# Patient Record
Sex: Male | Born: 1985 | Race: White | Hispanic: No | Marital: Single | Smoking: Current every day smoker
Health system: Southern US, Community
[De-identification: ages and names within clinical notes are randomized; demographics above are authoritative.]

## PROBLEM LIST (undated history)

## (undated) DIAGNOSIS — R05 Cough: Secondary | ICD-10-CM

## (undated) DIAGNOSIS — R059 Cough, unspecified: Secondary | ICD-10-CM

## (undated) DIAGNOSIS — F419 Anxiety disorder, unspecified: Secondary | ICD-10-CM

## (undated) DIAGNOSIS — N200 Calculus of kidney: Secondary | ICD-10-CM

## (undated) HISTORY — DX: Anxiety disorder, unspecified: F41.9

---

## 2011-05-07 ENCOUNTER — Telehealth (INDEPENDENT_AMBULATORY_CARE_PROVIDER_SITE_OTHER): Payer: Self-pay

## 2011-05-07 NOTE — Telephone Encounter (Signed)
LMOM for pt to call me so I can move his appt.

## 2011-05-08 ENCOUNTER — Telehealth (INDEPENDENT_AMBULATORY_CARE_PROVIDER_SITE_OTHER): Payer: Self-pay

## 2011-05-08 NOTE — Telephone Encounter (Signed)
Pt returned my call and I gave him the appt info.

## 2011-05-16 ENCOUNTER — Encounter (INDEPENDENT_AMBULATORY_CARE_PROVIDER_SITE_OTHER): Payer: Self-pay | Admitting: General Surgery

## 2011-05-16 ENCOUNTER — Ambulatory Visit (INDEPENDENT_AMBULATORY_CARE_PROVIDER_SITE_OTHER): Payer: BC Managed Care – PPO | Admitting: General Surgery

## 2011-05-16 VITALS — BP 122/82 | HR 80 | Resp 18 | Ht 67.0 in | Wt 131.4 lb

## 2011-05-16 DIAGNOSIS — K4090 Unilateral inguinal hernia, without obstruction or gangrene, not specified as recurrent: Secondary | ICD-10-CM

## 2011-05-16 NOTE — Progress Notes (Signed)
Patient ID: Donald Reid, male   DOB: 02/15/1985, 26 y.o.   MRN: [id number]  Chief Complaint  Patient presents with  . Inguinal Hernia    Right    HPI Donald Reid is a 26 y.o. male.  Referred by Dr. Jones HPI This is a 26-year-old male is otherwise very healthy who about a month ago while working noticed some pain in his right groin. He felt a small mass at that point. He pushes it back in very easily. It now comes out on a daily basis while he is working. It always goes away at night. He does have some discomfort that is associated with this. He has no change in his bowel movements or any change with urination.  History reviewed. No pertinent past medical history.  History reviewed. No pertinent past surgical history.  History reviewed. No pertinent family history.  Social History History  Substance Use Topics  . Smoking status: Current Everyday Smoker -- 1.0 packs/day    Types: Cigarettes  . Smokeless tobacco: Not on file  . Alcohol Use: No    No Known Allergies  No current outpatient prescriptions on file.    Review of Systems Review of Systems  Constitutional: Negative for fever, chills and unexpected weight change.  HENT: Negative for hearing loss, congestion, sore throat, trouble swallowing and voice change.   Eyes: Negative for visual disturbance.  Respiratory: Negative for cough and wheezing.   Cardiovascular: Negative for chest pain, palpitations and leg swelling.  Gastrointestinal: Negative for nausea, vomiting, abdominal pain, diarrhea, constipation, blood in stool, abdominal distention, anal bleeding and rectal pain.  Genitourinary: Negative for hematuria and difficulty urinating.  Musculoskeletal: Negative for arthralgias.  Skin: Negative for rash and wound.  Neurological: Negative for seizures, syncope, weakness and headaches.  Hematological: Negative for adenopathy. Does not bruise/bleed easily.  Psychiatric/Behavioral: Negative for confusion.     Blood pressure 122/82, pulse 80, resp. rate 18, height 5' 7" (1.702 m), weight 131 lb 6 oz (59.591 kg).  Physical Exam Physical Exam  Vitals reviewed. Constitutional: He appears well-developed and well-nourished.  Neck: Neck supple.  Cardiovascular: Normal rate, regular rhythm and normal heart sounds.   Pulmonary/Chest: Effort normal and breath sounds normal. He has no wheezes. He has no rales.  Abdominal: Soft. There is no tenderness. A hernia is present. Hernia confirmed positive in the right inguinal area. Hernia confirmed negative in the left inguinal area.  Lymphadenopathy:    He has no cervical adenopathy.     Assessment    RIH    Plan    We discussed observation versus repair.  We discussed both laparoscopic and open inguinal hernia repairs. I described the procedure in detail.  The patient was given educational material.  Goals should be achieved with surgery. We discussed the usage of mesh and the rationale behind that. We went over the pathophysiology of inguinal hernia. We have elected to perform open inguinal hernia repair with mesh.  We discussed the risks including bleeding, infection, recurrence, postoperative pain and chronic groin pain, testicular injury, urinary retention, numbness in groin and around incision.         Saliah Crisp 05/16/2011, 10:56 AM    

## 2011-05-19 ENCOUNTER — Other Ambulatory Visit (INDEPENDENT_AMBULATORY_CARE_PROVIDER_SITE_OTHER): Payer: Self-pay | Admitting: General Surgery

## 2011-05-21 ENCOUNTER — Ambulatory Visit (INDEPENDENT_AMBULATORY_CARE_PROVIDER_SITE_OTHER): Payer: Self-pay | Admitting: General Surgery

## 2011-05-26 ENCOUNTER — Encounter (HOSPITAL_COMMUNITY): Payer: Self-pay | Admitting: Pharmacy Technician

## 2011-05-28 ENCOUNTER — Encounter (HOSPITAL_COMMUNITY)
Admission: RE | Admit: 2011-05-28 | Discharge: 2011-05-28 | Disposition: A | Discharge status: Home / Self Care | Payer: BC Managed Care – PPO | Source: Ambulatory Visit | Attending: General Surgery | Admitting: General Surgery

## 2011-05-28 ENCOUNTER — Ambulatory Visit (HOSPITAL_COMMUNITY)
Admission: RE | Admit: 2011-05-28 | Discharge: 2011-05-28 | Disposition: A | Discharge status: Home / Self Care | Payer: BC Managed Care – PPO | Source: Ambulatory Visit | Attending: General Surgery | Admitting: General Surgery

## 2011-05-28 ENCOUNTER — Encounter (HOSPITAL_COMMUNITY): Payer: Self-pay

## 2011-05-28 DIAGNOSIS — Z87891 Personal history of nicotine dependence: Secondary | ICD-10-CM | POA: Insufficient documentation

## 2011-05-28 DIAGNOSIS — Z01812 Encounter for preprocedural laboratory examination: Secondary | ICD-10-CM | POA: Insufficient documentation

## 2011-05-28 DIAGNOSIS — Z01818 Encounter for other preprocedural examination: Secondary | ICD-10-CM | POA: Insufficient documentation

## 2011-05-28 HISTORY — DX: Cough, unspecified: R05.9

## 2011-05-28 HISTORY — DX: Cough: R05

## 2011-05-28 HISTORY — DX: Calculus of kidney: N20.0

## 2011-05-28 LAB — CBC
MCH: 31.3 pg (ref 26.0–34.0)
MCHC: 34.7 g/dL (ref 30.0–36.0)
Platelets: 208 10*3/uL (ref 150–400)

## 2011-05-28 LAB — SURGICAL PCR SCREEN: MRSA, PCR: NEGATIVE

## 2011-05-28 NOTE — Patient Instructions (Addendum)
20 LARRON ARMOR  05/28/2011   Your procedure is scheduled on: Friday 05-30-2011   Report to [facility] at 0700  AM.  Call this number if you have problems the morning of surgery: [phone]   Remember:DRIVER Humphrey Rolls MOTHER CELL [phone]   Do not eat food or drink liquids:After Midnight.  .  Take these medicines the morning of surgery with A SIP OF WATER: NO MEDS TO TAKE   Do not wear jewelry or make up.  Do not wear lotions, powders, or perfumes.Do not wear deodorant.    Do not bring valuables to the hospital.  Contacts, dentures or bridgework may not be worn into surgery.  Leave suitcase in the car. After surgery it may be brought to your room.  For patients admitted to the hospital, checkout time is 11:00 AM the day of discharge.     Special Instructions: BETASEPT Shower Use Special Wash: 1/2 bottle night before surgery and 1/2 bottle morning of surgery.neck down avoid private area   Please read over the following fact sheets that you were given: MRSA Information  [facility] pre op nurse phone number [phone], call if needed

## 2011-05-30 ENCOUNTER — Encounter (HOSPITAL_COMMUNITY): Payer: Self-pay | Admitting: *Deleted

## 2011-05-30 ENCOUNTER — Encounter (HOSPITAL_COMMUNITY)
Admission: RE | Disposition: A | Discharge status: Home / Self Care | Payer: Self-pay | Source: Ambulatory Visit | Attending: General Surgery

## 2011-05-30 ENCOUNTER — Ambulatory Visit (HOSPITAL_COMMUNITY): Payer: BC Managed Care – PPO | Admitting: Anesthesiology

## 2011-05-30 ENCOUNTER — Encounter (HOSPITAL_COMMUNITY): Payer: Self-pay | Admitting: Anesthesiology

## 2011-05-30 ENCOUNTER — Ambulatory Visit (HOSPITAL_COMMUNITY)
Admission: RE | Admit: 2011-05-30 | Discharge: 2011-05-30 | Disposition: A | Discharge status: Home / Self Care | Payer: BC Managed Care – PPO | Source: Ambulatory Visit | Attending: General Surgery | Admitting: General Surgery

## 2011-05-30 DIAGNOSIS — K4090 Unilateral inguinal hernia, without obstruction or gangrene, not specified as recurrent: Secondary | ICD-10-CM

## 2011-05-30 HISTORY — PX: INGUINAL HERNIA REPAIR: SHX194

## 2011-05-30 SURGERY — REPAIR, HERNIA, INGUINAL, ADULT
Anesthesia: General | Site: Abdomen | Laterality: Right | Wound class: Clean

## 2011-05-30 MED ORDER — ACETAMINOPHEN 10 MG/ML IV SOLN
INTRAVENOUS | Status: AC
  Filled 2011-05-30: qty 100

## 2011-05-30 MED ORDER — PROPOFOL 10 MG/ML IV BOLUS
INTRAVENOUS | Status: DC | PRN
  Administered 2011-05-30: 200 mg via INTRAVENOUS

## 2011-05-30 MED ORDER — LIDOCAINE HCL (CARDIAC) 10 MG/ML IV SOLN
INTRAVENOUS | Status: DC | PRN
  Administered 2011-05-30: 80 mg via INTRAVENOUS

## 2011-05-30 MED ORDER — ONDANSETRON HCL 4 MG/2ML IJ SOLN
INTRAMUSCULAR | Status: DC | PRN
  Administered 2011-05-30: 4 mg via INTRAVENOUS

## 2011-05-30 MED ORDER — KETOROLAC TROMETHAMINE 30 MG/ML IJ SOLN
15.0000 mg | Freq: Once | INTRAMUSCULAR | Status: AC | PRN
  Administered 2011-05-30: 30 mg via INTRAVENOUS

## 2011-05-30 MED ORDER — SUFENTANIL CITRATE 50 MCG/ML IV SOLN
INTRAVENOUS | Status: DC | PRN
  Administered 2011-05-30 (×3): 5 ug via INTRAVENOUS

## 2011-05-30 MED ORDER — ACETAMINOPHEN 10 MG/ML IV SOLN
INTRAVENOUS | Status: DC | PRN
  Administered 2011-05-30: 1000 mg via INTRAVENOUS

## 2011-05-30 MED ORDER — DEXAMETHASONE SODIUM PHOSPHATE 10 MG/ML IJ SOLN
INTRAMUSCULAR | Status: DC | PRN
  Administered 2011-05-30: 10 mg via INTRAVENOUS

## 2011-05-30 MED ORDER — BUPIVACAINE HCL (PF) 0.25 % IJ SOLN
INTRAMUSCULAR | Status: DC | PRN
  Administered 2011-05-30: 20 mL

## 2011-05-30 MED ORDER — FENTANYL CITRATE 0.05 MG/ML IJ SOLN
25.0000 ug | INTRAMUSCULAR | Status: DC | PRN
  Administered 2011-05-30 (×2): 25 ug via INTRAVENOUS

## 2011-05-30 MED ORDER — FENTANYL CITRATE 0.05 MG/ML IJ SOLN
INTRAMUSCULAR | Status: AC
  Filled 2011-05-30: qty 2

## 2011-05-30 MED ORDER — OXYCODONE-ACETAMINOPHEN 5-325 MG PO TABS: 1.0000 | ORAL_TABLET | ORAL | Status: AC | PRN

## 2011-05-30 MED ORDER — PROMETHAZINE HCL 25 MG/ML IJ SOLN: 6.2500 mg | INTRAMUSCULAR | Status: DC | PRN

## 2011-05-30 MED ORDER — CEFAZOLIN SODIUM 1-5 GM-% IV SOLN
INTRAVENOUS | Status: AC
  Filled 2011-05-30: qty 50

## 2011-05-30 MED ORDER — OXYCODONE HCL 5 MG PO TABS
ORAL_TABLET | ORAL | Status: AC
  Filled 2011-05-30: qty 1

## 2011-05-30 MED ORDER — MIDAZOLAM HCL 5 MG/5ML IJ SOLN
INTRAMUSCULAR | Status: DC | PRN
  Administered 2011-05-30: 1 mg via INTRAVENOUS
  Administered 2011-05-30: 2 mg via INTRAVENOUS

## 2011-05-30 MED ORDER — GLYCOPYRROLATE 0.2 MG/ML IJ SOLN
INTRAMUSCULAR | Status: DC | PRN
  Administered 2011-05-30: 0.2 mg via INTRAVENOUS

## 2011-05-30 MED ORDER — DROPERIDOL 2.5 MG/ML IJ SOLN
INTRAMUSCULAR | Status: DC | PRN
  Administered 2011-05-30: 0.625 mg via INTRAVENOUS

## 2011-05-30 MED ORDER — OXYCODONE HCL 5 MG PO TABS
5.0000 mg | ORAL_TABLET | ORAL | Status: DC | PRN
  Administered 2011-05-30 (×2): 5 mg via ORAL

## 2011-05-30 MED ORDER — LACTATED RINGERS IV SOLN
INTRAVENOUS | Status: DC
  Administered 2011-05-30: 10:00:00 via INTRAVENOUS
  Administered 2011-05-30: 1000 mL via INTRAVENOUS

## 2011-05-30 MED ORDER — 0.9 % SODIUM CHLORIDE (POUR BTL) OPTIME
TOPICAL | Status: DC | PRN
  Administered 2011-05-30: 1000 mL

## 2011-05-30 MED ORDER — BUPIVACAINE HCL (PF) 0.25 % IJ SOLN
INTRAMUSCULAR | Status: AC
  Filled 2011-05-30: qty 30

## 2011-05-30 MED ORDER — KETOROLAC TROMETHAMINE 30 MG/ML IJ SOLN
INTRAMUSCULAR | Status: AC
  Filled 2011-05-30: qty 1

## 2011-05-30 MED ORDER — CEFAZOLIN SODIUM 1-5 GM-% IV SOLN
1.0000 g | INTRAVENOUS | Status: AC
  Administered 2011-05-30: 1 g via INTRAVENOUS

## 2011-05-30 SURGICAL SUPPLY — 39 items
BENZOIN TINCTURE PRP APPL 2/3 (GAUZE/BANDAGES/DRESSINGS) IMPLANT
BLADE HEX COATED 2.75 (ELECTRODE) ×2 IMPLANT
CANISTER SUCTION 2500CC (MISCELLANEOUS) ×2 IMPLANT
CLOTH BEACON ORANGE TIMEOUT ST (SAFETY) ×2 IMPLANT
DECANTER SPIKE VIAL GLASS SM (MISCELLANEOUS) IMPLANT
DERMABOND ADVANCED (GAUZE/BANDAGES/DRESSINGS) ×2
DERMABOND ADVANCED .7 DNX12 (GAUZE/BANDAGES/DRESSINGS) ×2 IMPLANT
DRAIN PENROSE 18X1/2 LTX STRL (DRAIN) ×2 IMPLANT
DRAPE LAPAROSCOPIC ABDOMINAL (DRAPES) ×2 IMPLANT
ELECT REM PT RETURN 9FT ADLT (ELECTROSURGICAL) ×2
ELECTRODE REM PT RTRN 9FT ADLT (ELECTROSURGICAL) ×1 IMPLANT
GAUZE SPONGE 4X4 16PLY XRAY LF (GAUZE/BANDAGES/DRESSINGS) IMPLANT
GLOVE BIO SURGEON STRL SZ7 (GLOVE) ×2 IMPLANT
GLOVE BIOGEL PI IND STRL 7.0 (GLOVE) IMPLANT
GLOVE BIOGEL PI IND STRL 7.5 (GLOVE) ×1 IMPLANT
GLOVE BIOGEL PI INDICATOR 7.0 (GLOVE)
GLOVE BIOGEL PI INDICATOR 7.5 (GLOVE) ×1
GOWN PREVENTION PLUS LG XLONG (DISPOSABLE) ×2 IMPLANT
GOWN PREVENTION PLUS XLARGE (GOWN DISPOSABLE) ×2 IMPLANT
GOWN STRL NON-REIN LRG LVL3 (GOWN DISPOSABLE) ×2 IMPLANT
GOWN STRL REIN XL XLG (GOWN DISPOSABLE) IMPLANT
KIT BASIN OR (CUSTOM PROCEDURE TRAY) ×2 IMPLANT
MESH ULTRAPRO 3X6 7.6X15CM (Mesh General) ×2 IMPLANT
NEEDLE HYPO 25X1 1.5 SAFETY (NEEDLE) ×2 IMPLANT
NS IRRIG 1000ML POUR BTL (IV SOLUTION) ×2 IMPLANT
PACK GENERAL/GYN (CUSTOM PROCEDURE TRAY) ×2 IMPLANT
SPONGE GAUZE 4X4 12PLY (GAUZE/BANDAGES/DRESSINGS) IMPLANT
STRIP CLOSURE SKIN 1/2X4 (GAUZE/BANDAGES/DRESSINGS) IMPLANT
SUT MNCRL AB 4-0 PS2 18 (SUTURE) ×2 IMPLANT
SUT PROLENE 2 0 SH DA (SUTURE) ×6 IMPLANT
SUT SILK 2 0 SH (SUTURE) IMPLANT
SUT VIC AB 2-0 SH 27 (SUTURE) ×2
SUT VIC AB 2-0 SH 27X BRD (SUTURE) ×2 IMPLANT
SUT VIC AB 3-0 54XBRD REEL (SUTURE) IMPLANT
SUT VIC AB 3-0 BRD 54 (SUTURE)
SUT VIC AB 3-0 SH 27 (SUTURE) ×1
SUT VIC AB 3-0 SH 27XBRD (SUTURE) ×1 IMPLANT
SYR CONTROL 10ML LL (SYRINGE) ×2 IMPLANT
TOWEL OR 17X26 10 PK STRL BLUE (TOWEL DISPOSABLE) ×2 IMPLANT

## 2011-05-30 NOTE — Discharge Instructions (Signed)
[facility] ° °UMBILICAL OR INGUINAL HERNIA REPAIR: POST OP INSTRUCTIONS ° °Always review your discharge instruction sheet given to you by the facility where your surgery was performed. °IF YOU HAVE DISABILITY OR FAMILY LEAVE FORMS, YOU MUST BRING THEM TO THE OFFICE FOR PROCESSING.   °DO NOT GIVE THEM TO YOUR DOCTOR. ° °1. A  prescription for pain medication may be given to you upon discharge.  Take your pain medication as prescribed, if needed.  If narcotic pain medicine is not needed, then you may take acetaminophen (Tylenol), naprosyn (Alleve) or ibuprofen (Advil) as needed. °2. Take your usually prescribed medications unless otherwise directed. °3. If you need a refill on your pain medication, please contact your pharmacy.  They will contact our office to request authorization. Prescriptions will not be filled after 5 pm or on week-ends. °4. You should follow a light diet the first 24 hours after arrival home, such as soup and crackers, etc.  Be sure to include lots of fluids daily.  Resume your normal diet the day after surgery. °5. Most patients will experience some swelling and bruising around the umbilicus or in the groin and scrotum.  Ice packs and reclining will help.  Swelling and bruising can take several days to resolve.  °6. It is common to experience some constipation if taking pain medication after surgery.  Increasing fluid intake and taking a stool softener (such as Colace) will usually help or prevent this problem from occurring.  A mild laxative (Milk of Magnesia or Miralax) should be taken according to package directions if there are no bowel movements after 48 hours. °7. Unless discharge instructions indicate otherwise, you may remove your bandages 48 hours after surgery, and you may shower at that time.  You may have steri-strips (small skin tapes) in place directly over the incision.  These strips should be left on the skin for 7-10 days and will come off on their own.   If your surgeon used skin glue on the incision, you may shower in 24 hours.  The glue will flake off over the next 2-3 weeks.  Any sutures or staples will be removed at the office during your follow-up visit. °8. ACTIVITIES:  You may resume regular (light) daily activities beginning the next day--such as daily self-care, walking, climbing stairs--gradually increasing activities as tolerated.  You may have sexual intercourse when it is comfortable.  Refrain from any heavy lifting or straining until approved by your doctor. °a. You may drive when you are no longer taking prescription pain medication, you can comfortably wear a seatbelt, and you can safely maneuver your car and apply brakes. °b. RETURN TO WORK:  __________________________________________________________ °9. You should see your doctor in the office for a follow-up appointment approximately 2-3 weeks after your surgery.  Make sure that you call for this appointment within a day or two after you arrive home to insure a convenient appointment time. °10. OTHER INSTRUCTIONS:  __________________________________________________________________________________________________________________________________________________________________________________________  °WHEN TO CALL YOUR DOCTOR: °1. Fever over 101.0 °2. Inability to urinate °3. Nausea and/or vomiting °4. Extreme swelling or bruising °5. Continued bleeding from incision. °6. Increased pain, redness, or drainage from the incision ° °The clinic staff is available to answer your questions during regular business hours.  Please don’t hesitate to call and ask to speak to one of the nurses for clinical concerns.  If you have a medical emergency, go to the nearest emergency room or call 911.  A surgeon from [facility]   is always on call at the hospital ° ° °[address], [address], [place], [place]  [address] ? ° [address] [address], [place], [place]   [address] °[phone] ? [phone] ? FAX  [phone] °Web site: [email or web address] ° ° °

## 2011-05-30 NOTE — Anesthesia Preprocedure Evaluation (Signed)
Anesthesia Evaluation  Patient identified by MRN, date of birth, ID band Patient awake    Reviewed: Allergy & Precautions, H&P , NPO status , Patient's Chart, lab work & pertinent test results  Airway Mallampati: II TM Distance: >3 FB Neck ROM: Full    Dental No notable dental hx.    Pulmonary Current Smoker,  breath sounds clear to auscultation  Pulmonary exam normal       Cardiovascular negative cardio ROS  Rhythm:Regular Rate:Normal     Neuro/Psych negative neurological ROS  negative psych ROS   GI/Hepatic negative GI ROS, Neg liver ROS,   Endo/Other  negative endocrine ROS  Renal/GU negative Renal ROS  negative genitourinary   Musculoskeletal negative musculoskeletal ROS (+)   Abdominal   Peds negative pediatric ROS (+)  Hematology negative hematology ROS (+)   Anesthesia Other Findings   Reproductive/Obstetrics negative OB ROS                           Anesthesia Physical Anesthesia Plan  ASA: II  Anesthesia Plan: General   Post-op Pain Management:    Induction: Intravenous  Airway Management Planned: LMA  Additional Equipment:   Intra-op Plan:   Post-operative Plan:   Informed Consent: I have reviewed the patients History and Physical, chart, labs and discussed the procedure including the risks, benefits and alternatives for the proposed anesthesia with the patient or authorized representative who has indicated his/her understanding and acceptance.   Dental advisory given  Plan Discussed with: CRNA  Anesthesia Plan Comments:         Anesthesia Quick Evaluation  

## 2011-05-30 NOTE — H&P (View-Only) (Signed)
Patient ID: Genelle Bal P. Fofana, male   DOB: 1985-09-01, 26 y.o.   MRN: [id number]  Chief Complaint  Patient presents with  . Inguinal Hernia    Right    HPI Miking P. Majerus is a 26 y.o. male.  Referred by Dr. Yetta Barre HPI This is a 26 year old male is otherwise very healthy who about a month ago while working noticed some pain in his right groin. He felt a small mass at that point. He pushes it back in very easily. It now comes out on a daily basis while he is working. It always goes away at night. He does have some discomfort that is associated with this. He has no change in his bowel movements or any change with urination.  History reviewed. No pertinent past medical history.  History reviewed. No pertinent past surgical history.  History reviewed. No pertinent family history.  Social History History  Substance Use Topics  . Smoking status: Current Everyday Smoker -- 1.0 packs/day    Types: Cigarettes  . Smokeless tobacco: Not on file  . Alcohol Use: No    No Known Allergies  No current outpatient prescriptions on file.    Review of Systems Review of Systems  Constitutional: Negative for fever, chills and unexpected weight change.  HENT: Negative for hearing loss, congestion, sore throat, trouble swallowing and voice change.   Eyes: Negative for visual disturbance.  Respiratory: Negative for cough and wheezing.   Cardiovascular: Negative for chest pain, palpitations and leg swelling.  Gastrointestinal: Negative for nausea, vomiting, abdominal pain, diarrhea, constipation, blood in stool, abdominal distention, anal bleeding and rectal pain.  Genitourinary: Negative for hematuria and difficulty urinating.  Musculoskeletal: Negative for arthralgias.  Skin: Negative for rash and wound.  Neurological: Negative for seizures, syncope, weakness and headaches.  Hematological: Negative for adenopathy. Does not bruise/bleed easily.  Psychiatric/Behavioral: Negative for confusion.     Blood pressure 122/82, pulse 80, resp. rate 18, height 5\' 7"  (1.702 m), weight 131 lb 6 oz (59.591 kg).  Physical Exam Physical Exam  Vitals reviewed. Constitutional: He appears well-developed and well-nourished.  Neck: Neck supple.  Cardiovascular: Normal rate, regular rhythm and normal heart sounds.   Pulmonary/Chest: Effort normal and breath sounds normal. He has no wheezes. He has no rales.  Abdominal: Soft. There is no tenderness. A hernia is present. Hernia confirmed positive in the right inguinal area. Hernia confirmed negative in the left inguinal area.  Lymphadenopathy:    He has no cervical adenopathy.     Assessment    RIH    Plan    We discussed observation versus repair.  We discussed both laparoscopic and open inguinal hernia repairs. I described the procedure in detail.  The patient was given educational material.  Goals should be achieved with surgery. We discussed the usage of mesh and the rationale behind that. We went over the pathophysiology of inguinal hernia. We have elected to perform open inguinal hernia repair with mesh.  We discussed the risks including bleeding, infection, recurrence, postoperative pain and chronic groin pain, testicular injury, urinary retention, numbness in groin and around incision.         Rowynn Mcweeney 05/16/2011, 10:56 AM

## 2011-05-30 NOTE — Op Note (Signed)
Preoperative diagnosis: Symptomatic right inguinal hernia Postoperative diagnosis: Indirect right inguinal hernia Procedure: Right inguinal hernia repair with UltraPro mesh patch Surgeon: Dr. Harden Mo Specimens: None Estimated blood loss: Minimal Complications: None Sponge needle count correct x2 at operation Disposition to recovery in stable condition Anesthesia: Gen. With LMA  Indications: This is a 26 year old male with a symptomatic right inguinal hernia. We discussed the risks and benefits of repair and decided to proceed with an open repair.  Procedure: After informed consent was obtained the patient was taken to the operating room. He was administered 1 g of intravenous cefazolin. Sequential compression devices were placed on his legs prior to induction. He then underwent general anesthesia with an LMA. His right groin was then prepped and draped in the standard sterile surgical fashion. A surgical timeout was performed.  I infiltrated quarter percent Marcaine throughout the right groin. I also performed an ilioinguinal nerve block. I then made an incision used cautery to divide the tissue down to the external oblique. The external oblique was entered sharply. I opened this through the external ring. I then encircled the spermatic cord with a Penrose. This floor was very weak but there was no obvious hernia. He had an indirect hernia sac which was dissected free from the remaining cord structures which were preserved. I then placed this back inside the abdomen. I then closed his ring down with a 2-0 Vicryl suture. Following this I then placed an UltraPro mesh patch overlying the floor. This was sutured into position in numerous places with 2-0 Prolene suture. I then made a cut and wrapped this around the spermatic cord. I then packed this together as well. The mesh was in good position and obliterated the defect. Hemostasis was observed. I then closes with 2-0 Vicryl, 3-0 Vicryl, and 4  Monocryl. Dermabond was placed on the wound. He tolerated this well was extubated transferred recovery in stable condition.

## 2011-05-30 NOTE — Transfer of Care (Signed)
Immediate Anesthesia Transfer of Care Note  Patient: Donald Reid  Procedure(s) Performed: Procedure(s) (LRB): HERNIA REPAIR INGUINAL ADULT (Right) INSERTION OF MESH (Right)  Patient Location: PACU  Anesthesia Type: General  Level of Consciousness: awake, patient cooperative and responds to stimulation  Airway & Oxygen Therapy: Patient Spontanous Breathing and Patient connected to face mask oxygen  Post-op Assessment: Report given to PACU RN, Post -op Vital signs reviewed and stable and Patient moving all extremities X 4  Post vital signs: stable  Complications: No apparent anesthesia complications

## 2011-05-30 NOTE — Interval H&P Note (Signed)
History and Physical Interval Note:  05/30/2011 8:45 AM  Vidal Schwalbe  has presented today for surgery, with the diagnosis of fix hole in right groin with screen  The various methods of treatment have been discussed with the patient and family. After consideration of risks, benefits and other options for treatment, the patient has consented to  Procedure(s) (LRB): HERNIA REPAIR INGUINAL ADULT (Right) INSERTION OF MESH (N/A) as a surgical intervention .  The patients' history has been reviewed, patient examined, no change in status, stable for surgery.  I have reviewed the patients' chart and labs.  Questions were answered to the patient's satisfaction.     Mahala Rommel

## 2011-05-30 NOTE — Anesthesia Postprocedure Evaluation (Signed)
  Anesthesia Post-op Note  Patient: Donald Reid  Procedure(s) Performed: Procedure(s) (LRB): HERNIA REPAIR INGUINAL ADULT (Right) INSERTION OF MESH (Right)  Patient Location: PACU  Anesthesia Type: General  Level of Consciousness: awake and alert   Airway and Oxygen Therapy: Patient Spontanous Breathing  Post-op Pain: mild  Post-op Assessment: Post-op Vital signs reviewed, Patient's Cardiovascular Status Stable, Respiratory Function Stable, Patent Airway and No signs of Nausea or vomiting  Post-op Vital Signs: stable  Complications: No apparent anesthesia complications

## 2011-06-03 ENCOUNTER — Encounter (HOSPITAL_COMMUNITY): Payer: Self-pay | Admitting: General Surgery

## 2011-06-26 ENCOUNTER — Ambulatory Visit (INDEPENDENT_AMBULATORY_CARE_PROVIDER_SITE_OTHER): Payer: BC Managed Care – PPO | Admitting: General Surgery

## 2011-06-26 ENCOUNTER — Encounter (INDEPENDENT_AMBULATORY_CARE_PROVIDER_SITE_OTHER): Payer: Self-pay | Admitting: General Surgery

## 2011-06-26 VITALS — BP 104/78 | HR 80 | Temp 97.8°F | Ht 67.0 in | Wt 131.0 lb

## 2011-06-26 DIAGNOSIS — Z09 Encounter for follow-up examination after completed treatment for conditions other than malignant neoplasm: Secondary | ICD-10-CM

## 2011-06-26 NOTE — Progress Notes (Signed)
Subjective:     Patient ID: Donald Reid, male   DOB: [birth year], 26 y.o.   MRN: [id number]  HPI 70 yom s/p rih repair with mesh.  He is doing well except for some swelling.  He is returning to normal activities and is otherwise doing well.  Review of Systems     Objective:   Physical Exam    healing right groin incision without infection, small healing ridge present Assessment:     S/p RIH repair    Plan:     May return to work and start resuming normal activities I told him the incision would continue to soften over time

## 2015-07-25 ENCOUNTER — Emergency Department: Payer: Self-pay

## 2015-07-25 ENCOUNTER — Emergency Department
Admission: EM | Admit: 2015-07-25 | Discharge: 2015-07-25 | Disposition: A | Discharge status: Home / Self Care | Payer: Self-pay | Attending: Emergency Medicine | Admitting: Emergency Medicine

## 2015-07-25 DIAGNOSIS — R110 Nausea: Secondary | ICD-10-CM | POA: Insufficient documentation

## 2015-07-25 DIAGNOSIS — N200 Calculus of kidney: Secondary | ICD-10-CM | POA: Insufficient documentation

## 2015-07-25 DIAGNOSIS — F17210 Nicotine dependence, cigarettes, uncomplicated: Secondary | ICD-10-CM | POA: Insufficient documentation

## 2015-07-25 DIAGNOSIS — F1290 Cannabis use, unspecified, uncomplicated: Secondary | ICD-10-CM | POA: Insufficient documentation

## 2015-07-25 LAB — URINALYSIS COMPLETE WITH MICROSCOPIC (ARMC ONLY)
BILIRUBIN URINE: NEGATIVE
Bacteria, UA: NONE SEEN
Glucose, UA: NEGATIVE mg/dL
KETONES UR: NEGATIVE mg/dL
Leukocytes, UA: NEGATIVE
NITRITE: NEGATIVE
PROTEIN: 100 mg/dL — AB
SPECIFIC GRAVITY, URINE: 1.024 (ref 1.005–1.030)
pH: 6 (ref 5.0–8.0)

## 2015-07-25 LAB — CBC
HCT: 45 % (ref 40.0–52.0)
HEMOGLOBIN: 15.6 g/dL (ref 13.0–18.0)
MCH: 31.5 pg (ref 26.0–34.0)
MCHC: 34.6 g/dL (ref 32.0–36.0)
MCV: 91 fL (ref 80.0–100.0)
PLATELETS: 256 10*3/uL (ref 150–440)
RBC: 4.95 MIL/uL (ref 4.40–5.90)
RDW: 13.4 % (ref 11.5–14.5)
WBC: 13.4 10*3/uL — ABNORMAL HIGH (ref 3.8–10.6)

## 2015-07-25 LAB — BASIC METABOLIC PANEL
Anion gap: 9 (ref 5–15)
BUN: 19 mg/dL (ref 6–20)
CHLORIDE: 105 mmol/L (ref 101–111)
CO2: 23 mmol/L (ref 22–32)
CREATININE: 0.92 mg/dL (ref 0.61–1.24)
Calcium: 8.7 mg/dL — ABNORMAL LOW (ref 8.9–10.3)
Glucose, Bld: 149 mg/dL — ABNORMAL HIGH (ref 65–99)
POTASSIUM: 3.6 mmol/L (ref 3.5–5.1)
SODIUM: 137 mmol/L (ref 135–145)

## 2015-07-25 MED ORDER — ONDANSETRON 4 MG PO TBDP: 4.0000 mg | ORAL_TABLET | Freq: Three times a day (TID) | ORAL | Status: DC | PRN

## 2015-07-25 MED ORDER — FENTANYL CITRATE (PF) 100 MCG/2ML IJ SOLN
INTRAMUSCULAR | Status: AC
  Administered 2015-07-25: 25 ug via INTRAVENOUS
  Filled 2015-07-25: qty 2

## 2015-07-25 MED ORDER — SODIUM CHLORIDE 0.9 % IV BOLUS (SEPSIS)
1000.0000 mL | Freq: Once | INTRAVENOUS | Status: AC
  Administered 2015-07-25: 1000 mL via INTRAVENOUS

## 2015-07-25 MED ORDER — FENTANYL CITRATE (PF) 100 MCG/2ML IJ SOLN
25.0000 ug | Freq: Once | INTRAMUSCULAR | Status: AC
  Administered 2015-07-25: 25 ug via INTRAVENOUS

## 2015-07-25 MED ORDER — ONDANSETRON HCL 4 MG/2ML IJ SOLN
INTRAMUSCULAR | Status: AC
  Administered 2015-07-25: 4 mg via INTRAVENOUS
  Filled 2015-07-25: qty 2

## 2015-07-25 MED ORDER — OXYCODONE-ACETAMINOPHEN 5-325 MG PO TABS: 1.0000 | ORAL_TABLET | ORAL | Status: DC | PRN

## 2015-07-25 MED ORDER — TAMSULOSIN HCL 0.4 MG PO CAPS
0.4000 mg | ORAL_CAPSULE | Freq: Every day | ORAL | Status: DC
Start: 2015-07-25 — End: 2015-07-30

## 2015-07-25 MED ORDER — ONDANSETRON HCL 4 MG/2ML IJ SOLN
4.0000 mg | Freq: Once | INTRAMUSCULAR | Status: AC
  Administered 2015-07-25: 4 mg via INTRAVENOUS

## 2015-07-25 MED ORDER — TAMSULOSIN HCL 0.4 MG PO CAPS
0.4000 mg | ORAL_CAPSULE | Freq: Once | ORAL | Status: AC
  Administered 2015-07-25: 0.4 mg via ORAL
  Filled 2015-07-25: qty 1

## 2015-07-25 MED ORDER — OXYCODONE-ACETAMINOPHEN 5-325 MG PO TABS
1.0000 | ORAL_TABLET | Freq: Once | ORAL | Status: AC
  Administered 2015-07-25: 1 via ORAL
  Filled 2015-07-25: qty 1

## 2015-07-25 NOTE — ED Notes (Signed)
Discharge instructions reviewed with patient. Questions fielded by this RN. Patient verbalizes understanding of instructions. Patient discharged home in stable condition per Brown MD . No acute distress noted at time of discharge.   

## 2015-07-25 NOTE — Discharge Instructions (Signed)
Kidney Stones °Kidney stones (urolithiasis) are deposits that form inside your kidneys. The intense pain is caused by the stone moving through the urinary tract. When the stone moves, the ureter goes into spasm around the stone. The stone is usually passed in the urine.  °CAUSES  °· A disorder that makes certain neck glands produce too much parathyroid hormone (primary hyperparathyroidism). °· A buildup of uric acid crystals, similar to gout in your joints. °· Narrowing (stricture) of the ureter. °· A kidney obstruction present at birth (congenital obstruction). °· Previous surgery on the kidney or ureters. °· Numerous kidney infections. °SYMPTOMS  °· Feeling sick to your stomach (nauseous). °· Throwing up (vomiting). °· Blood in the urine (hematuria). °· Pain that usually spreads (radiates) to the groin. °· Frequency or urgency of urination. °DIAGNOSIS  °· Taking a history and physical exam. °· Blood or urine tests. °· CT scan. °· Occasionally, an examination of the inside of the urinary bladder (cystoscopy) is performed. °TREATMENT  °· Observation. °· Increasing your fluid intake. °· Extracorporeal shock wave lithotripsy--This is a noninvasive procedure that uses shock waves to break up kidney stones. °· Surgery may be needed if you have severe pain or persistent obstruction. There are various surgical procedures. Most of the procedures are performed with the use of small instruments. Only small incisions are needed to accommodate these instruments, so recovery time is minimized. °The size, location, and chemical composition are all important variables that will determine the proper choice of action for you. Talk to your health care provider to better understand your situation so that you will minimize the risk of injury to yourself and your kidney.  °HOME CARE INSTRUCTIONS  °· Drink enough water and fluids to keep your urine clear or pale yellow. This will help you to pass the stone or stone fragments. °· Strain  all urine through the provided strainer. Keep all particulate matter and stones for your health care provider to see. The stone causing the pain may be as small as a grain of salt. It is very important to use the strainer each and every time you pass your urine. The collection of your stone will allow your health care provider to analyze it and verify that a stone has actually passed. The stone analysis will often identify what you can do to reduce the incidence of recurrences. °· Only take over-the-counter or prescription medicines for pain, discomfort, or fever as directed by your health care provider. °· Keep all follow-up visits as told by your health care provider. This is important. °· Get follow-up X-rays if required. The absence of pain does not always mean that the stone has passed. It may have only stopped moving. If the urine remains completely obstructed, it can cause loss of kidney function or even complete destruction of the kidney. It is your responsibility to make sure X-rays and follow-ups are completed. Ultrasounds of the kidney can show blockages and the status of the kidney. Ultrasounds are not associated with any radiation and can be performed easily in a matter of minutes. °· Make changes to your daily diet as told by your health care provider. You may be told to: °¨ Limit the amount of salt that you eat. °¨ Eat 5 or more servings of fruits and vegetables each day. °¨ Limit the amount of meat, poultry, fish, and eggs that you eat. °· Collect a 24-hour urine sample as told by your health care provider. You may need to collect another urine sample every 6-12   months. °SEEK MEDICAL CARE IF: °· You experience pain that is progressive and unresponsive to any pain medicine you have been prescribed. °SEEK IMMEDIATE MEDICAL CARE IF:  °· Pain cannot be controlled with the prescribed medicine. °· You have a fever or shaking chills. °· The severity or intensity of pain increases over 18 hours and is not  relieved by pain medicine. °· You develop a new onset of abdominal pain. °· You feel faint or pass out. °· You are unable to urinate. °  °This information is not intended to replace advice given to you by your health care provider. Make sure you discuss any questions you have with your health care provider. °  °Document Released: 01/13/2005 Document Revised: 10/04/2014 Document Reviewed: 06/16/2012 °Elsevier Interactive Patient Education ©2016 [employer] ° °

## 2015-07-25 NOTE — ED Notes (Addendum)
Patient presents to ED with c/o left sided back/flank pain 30 min PTA. Pt reports hx of kidney stone, pt reports unsure if symptoms are similar. Pt pale, unable to sit still. MD at beside.

## 2015-07-25 NOTE — ED Provider Notes (Signed)
[facility] Emergency Department Provider Note  ____________________________________________  Time seen: 4:00 AM  I have reviewed the triage vital signs and the nursing notes.   HISTORY  Chief Complaint Flank Pain      HPI Donald Reid is a 30 y.o. male presents with acute onset of 10 out of 10 nonradiating left flank pain approximately 30 minutes before presentation patient denies any hematuria or dysuria. Patient denies any fever afebrile on presentation with temperature 97.9. Patient does admit to a history of kidney stones in the past. Patient denies any aggravating or alleviating factors.     Past Medical History  Diagnosis Date  . Cough LAST 2 DAYS    OCCASIONAL GREEN SPUTUM  . Kidney stone 6-7- YRS AGO    DID NOT PASS STONE    There are no active problems to display for this patient.   Past Surgical History  Procedure Laterality Date  . Inguinal hernia repair  05/30/2011    Procedure: HERNIA REPAIR INGUINAL ADULT;  Surgeon: Emelia LoronMatthew Wakefield, MD;  Location: [facility];  Service: General;  Laterality: Right;    Current Outpatient Rx  Name  Route  Sig  Dispense  Refill  . dextromethorphan-guaiFENesin (MUCINEX DM) 30-600 MG per 12 hr tablet   Oral   Take 1 tablet by mouth every 12 (twelve) hours.           Allergies No known drug allergies No family history on file.  Social History Social History  Substance Use Topics  . Smoking status: Current Every Day Smoker -- 1.00 packs/day for 11 years    Types: Cigarettes  . Smokeless tobacco: Never Used  . Alcohol Use: No    Review of Systems  Constitutional: Negative for fever. Eyes: Negative for visual changes. ENT: Negative for sore throat. Cardiovascular: Negative for chest pain. Respiratory: Negative for shortness of breath. Gastrointestinal:Positive for left flank pain and nausea Genitourinary: Negative for dysuria. Musculoskeletal: Negative for back pain. Skin: Negative for  rash. Neurological: Negative for headaches, focal weakness or numbness.   10-point ROS otherwise negative.  ____________________________________________   PHYSICAL EXAM:  VITAL SIGNS: ED Triage Vitals  Enc Vitals Group     BP 07/25/15 0353 89/52 mmHg     Pulse Rate 07/25/15 0353 52     Resp 07/25/15 0353 18     Temp 07/25/15 0500 97.9 F (36.6 C)     Temp Source 07/25/15 0500 Oral     SpO2 07/25/15 0353 100 %     Weight 07/25/15 0352 127 lb (57.607 kg)     Height 07/25/15 0352 5\' 7"  (1.702 m)     Head Cir --      Peak Flow --      Pain Score 07/25/15 0352 10     Pain Loc --      Pain Edu? --      Excl. in GC? --      Constitutional: Alert and oriented. Apparent discomfort Eyes: Conjunctivae are normal. PERRL. Normal extraocular movements. ENT   Head: Normocephalic and atraumatic.   Nose: No congestion/rhinnorhea.   Mouth/Throat: Mucous membranes are moist.   Neck: No stridor. Hematological/Lymphatic/Immunilogical: No cervical lymphadenopathy. Cardiovascular: Normal rate, regular rhythm. Normal and symmetric distal pulses are present in all extremities. No murmurs, rubs, or gallops. Respiratory: Normal respiratory effort without tachypnea nor retractions. Breath sounds are clear and equal bilaterally. No wheezes/rales/rhonchi. Gastrointestinal: Soft and nontender. No distention. There is no CVA tenderness. Genitourinary: deferred Musculoskeletal: Nontender with normal  range of motion in all extremities. No joint effusions.  No lower extremity tenderness nor edema. Neurologic:  Normal speech and language. No gross focal neurologic deficits are appreciated. Speech is normal.  Skin:  Skin is warm, dry and intact. No rash noted. Psychiatric: Mood and affect are normal. Speech and behavior are normal. Patient exhibits appropriate insight and judgment.  ____________________________________________    LABS (pertinent positives/negatives)  Labs Reviewed  CBC  - Abnormal; Notable for the following:    WBC 13.4 (*)    All other components within normal limits  BASIC METABOLIC PANEL - Abnormal; Notable for the following:    Glucose, Bld 149 (*)    Calcium 8.7 (*)    All other components within normal limits  URINALYSIS COMPLETEWITH MICROSCOPIC ([facility] ONLY) - Abnormal; Notable for the following:    Color, Urine AMBER (*)    APPearance CLOUDY (*)    Hgb urine dipstick 3+ (*)    Protein, ur 100 (*)    Squamous Epithelial / LPF 0-5 (*)    All other components within normal limits         RADIOLOGY   CT Renal Stone Study (Final result) Result time: 07/25/15 05:31:20   Final result by Rad Results In Interface (07/25/15 05:31:20)   Narrative:   CLINICAL DATA: Left-sided back and flank pain for 30 minutes.  EXAM: CT ABDOMEN AND PELVIS WITHOUT CONTRAST  TECHNIQUE: Multidetector CT imaging of the abdomen and pelvis was performed following the standard protocol without IV contrast.  COMPARISON: None  FINDINGS: There is a 4 x 4 x 5 mm obstructing calculus of the proximal left ureter at the upper L3 level. Moderate hydronephrosis. No other urinary calculi are evident. No other acute findings are evident.  There are unremarkable unenhanced appearances of the liver, spleen, pancreas, adrenals, gallbladder and bile ducts.  The abdominal aorta is normal in caliber. There is no atherosclerotic calcification. There is no adenopathy in the abdomen or pelvis.  There are normal appearances of the stomach, small bowel and colon. The appendix is normal.  No significant abnormality in the lower chest. No significant skeletal lesion.  IMPRESSION: Obstructing 4 x 4 x 5 mm proximal left ureteral calculus at the L3 level. Moderate hydronephrosis.   Electronically Signed By: Ellery Plunkaniel R Mitchell M.D. On: 07/25/2015 05:31             INITIAL IMPRESSION / ASSESSMENT AND PLAN / ED COURSE  Pertinent labs & imaging results that  were available during my care of the patient were reviewed by me and considered in my medical decision making (see chart for details).  Patient given fentanyl with resolution of pain. Patient referred to see Dr. France RavensMacdirmid urology for further outpatient management. ____________________________________________   FINAL CLINICAL IMPRESSION(S) / ED DIAGNOSES  Final diagnoses:  Kidney stone on left side      Darci Currentandolph N Brown, MD 07/25/15 [phone]

## 2015-07-25 NOTE — ED Notes (Signed)
Pt in with co left flank pain hx of kidney stones. States does have some dysuria at this time.

## 2015-07-25 NOTE — ED Notes (Signed)
MD at bedside. 

## 2015-07-26 ENCOUNTER — Emergency Department
Admission: EM | Admit: 2015-07-26 | Discharge: 2015-07-26 | Disposition: A | Discharge status: Home / Self Care | Payer: Self-pay | Attending: Emergency Medicine | Admitting: Emergency Medicine

## 2015-07-26 ENCOUNTER — Encounter: Payer: Self-pay | Admitting: Emergency Medicine

## 2015-07-26 DIAGNOSIS — F1290 Cannabis use, unspecified, uncomplicated: Secondary | ICD-10-CM | POA: Insufficient documentation

## 2015-07-26 DIAGNOSIS — Z79899 Other long term (current) drug therapy: Secondary | ICD-10-CM | POA: Insufficient documentation

## 2015-07-26 DIAGNOSIS — N23 Unspecified renal colic: Secondary | ICD-10-CM | POA: Insufficient documentation

## 2015-07-26 DIAGNOSIS — F17210 Nicotine dependence, cigarettes, uncomplicated: Secondary | ICD-10-CM | POA: Insufficient documentation

## 2015-07-26 MED ORDER — SODIUM CHLORIDE 0.9 % IV BOLUS (SEPSIS)
1000.0000 mL | Freq: Once | INTRAVENOUS | Status: AC
  Administered 2015-07-26: 1000 mL via INTRAVENOUS

## 2015-07-26 MED ORDER — ONDANSETRON HCL 4 MG/2ML IJ SOLN
4.0000 mg | Freq: Once | INTRAMUSCULAR | Status: AC
  Administered 2015-07-26: 4 mg via INTRAVENOUS

## 2015-07-26 MED ORDER — ONDANSETRON HCL 4 MG/2ML IJ SOLN
INTRAMUSCULAR | Status: AC
  Administered 2015-07-26: 4 mg via INTRAVENOUS
  Filled 2015-07-26: qty 2

## 2015-07-26 MED ORDER — OXYCODONE-ACETAMINOPHEN 5-325 MG PO TABS
ORAL_TABLET | ORAL | Status: AC
  Administered 2015-07-26: 2 via ORAL
  Filled 2015-07-26: qty 2

## 2015-07-26 MED ORDER — KETOROLAC TROMETHAMINE 30 MG/ML IJ SOLN
15.0000 mg | Freq: Once | INTRAMUSCULAR | Status: AC
  Administered 2015-07-26: 15 mg via INTRAVENOUS

## 2015-07-26 MED ORDER — KETOROLAC TROMETHAMINE 30 MG/ML IJ SOLN
INTRAMUSCULAR | Status: AC
  Administered 2015-07-26: 15 mg via INTRAVENOUS
  Filled 2015-07-26: qty 1

## 2015-07-26 MED ORDER — OXYCODONE-ACETAMINOPHEN 5-325 MG PO TABS
2.0000 | ORAL_TABLET | Freq: Once | ORAL | Status: AC
  Administered 2015-07-26: 2 via ORAL

## 2015-07-26 NOTE — ED Notes (Signed)
Tolerated PO well.  No N/V

## 2015-07-26 NOTE — Discharge Instructions (Signed)
Kidney Stones °Kidney stones (urolithiasis) are deposits that form inside your kidneys. The intense pain is caused by the stone moving through the urinary tract. When the stone moves, the ureter goes into spasm around the stone. The stone is usually passed in the urine.  °CAUSES  °· A disorder that makes certain neck glands produce too much parathyroid hormone (primary hyperparathyroidism). °· A buildup of uric acid crystals, similar to gout in your joints. °· Narrowing (stricture) of the ureter. °· A kidney obstruction present at birth (congenital obstruction). °· Previous surgery on the kidney or ureters. °· Numerous kidney infections. °SYMPTOMS  °· Feeling sick to your stomach (nauseous). °· Throwing up (vomiting). °· Blood in the urine (hematuria). °· Pain that usually spreads (radiates) to the groin. °· Frequency or urgency of urination. °DIAGNOSIS  °· Taking a history and physical exam. °· Blood or urine tests. °· CT scan. °· Occasionally, an examination of the inside of the urinary bladder (cystoscopy) is performed. °TREATMENT  °· Observation. °· Increasing your fluid intake. °· Extracorporeal shock wave lithotripsy--This is a noninvasive procedure that uses shock waves to break up kidney stones. °· Surgery may be needed if you have severe pain or persistent obstruction. There are various surgical procedures. Most of the procedures are performed with the use of small instruments. Only small incisions are needed to accommodate these instruments, so recovery time is minimized. °The size, location, and chemical composition are all important variables that will determine the proper choice of action for you. Talk to your health care provider to better understand your situation so that you will minimize the risk of injury to yourself and your kidney.  °HOME CARE INSTRUCTIONS  °· Drink enough water and fluids to keep your urine clear or pale yellow. This will help you to pass the stone or stone fragments. °· Strain  all urine through the provided strainer. Keep all particulate matter and stones for your health care provider to see. The stone causing the pain may be as small as a grain of salt. It is very important to use the strainer each and every time you pass your urine. The collection of your stone will allow your health care provider to analyze it and verify that a stone has actually passed. The stone analysis will often identify what you can do to reduce the incidence of recurrences. °· Only take over-the-counter or prescription medicines for pain, discomfort, or fever as directed by your health care provider. °· Keep all follow-up visits as told by your health care provider. This is important. °· Get follow-up X-rays if required. The absence of pain does not always mean that the stone has passed. It may have only stopped moving. If the urine remains completely obstructed, it can cause loss of kidney function or even complete destruction of the kidney. It is your responsibility to make sure X-rays and follow-ups are completed. Ultrasounds of the kidney can show blockages and the status of the kidney. Ultrasounds are not associated with any radiation and can be performed easily in a matter of minutes. °· Make changes to your daily diet as told by your health care provider. You may be told to: °¨ Limit the amount of salt that you eat. °¨ Eat 5 or more servings of fruits and vegetables each day. °¨ Limit the amount of meat, poultry, fish, and eggs that you eat. °· Collect a 24-hour urine sample as told by your health care provider. You may need to collect another urine sample every 6-12   months. °SEEK MEDICAL CARE IF: °· You experience pain that is progressive and unresponsive to any pain medicine you have been prescribed. °SEEK IMMEDIATE MEDICAL CARE IF:  °· Pain cannot be controlled with the prescribed medicine. °· You have a fever or shaking chills. °· The severity or intensity of pain increases over 18 hours and is not  relieved by pain medicine. °· You develop a new onset of abdominal pain. °· You feel faint or pass out. °· You are unable to urinate. °  °This information is not intended to replace advice given to you by your health care provider. Make sure you discuss any questions you have with your health care provider. °  °Document Released: 01/13/2005 Document Revised: 10/04/2014 Document Reviewed: 06/16/2012 °Elsevier Interactive Patient Education ©2016 [employer] ° °Renal Colic °Renal colic is pain that is caused by passing a kidney stone. The pain can be sharp and severe. It may be felt in the back, abdomen, side (flank), or groin. It can cause nausea. Renal colic can come and go. °HOME CARE INSTRUCTIONS °Watch your condition for any changes. The following actions may help to lessen any discomfort that you are feeling: °· Take medicines only as directed by your health care provider. °· Ask your health care provider if it is okay to take over-the-counter pain medicine. °· Drink enough fluid to keep your urine clear or pale yellow. Drink 6-8 glasses of water each day. °· Limit the amount of salt that you eat to less than 2 grams per day. °· Reduce the amount of protein in your diet. Eat less meat, fish, nuts, and dairy. °· Avoid foods such as spinach, rhubarb, nuts, or bran. These may make kidney stones more likely to form. °SEEK MEDICAL CARE IF: °· You have a fever or chills. °· Your urine smells bad or looks cloudy. °· You have pain or burning when you pass urine. °SEEK IMMEDIATE MEDICAL CARE IF: °· Your flank pain or groin pain suddenly worsens. °· You become confused or disoriented or you lose consciousness. °  °This information is not intended to replace advice given to you by your health care provider. Make sure you discuss any questions you have with your health care provider. °  °Document Released: 10/23/2004 Document Revised: 02/03/2014 Document Reviewed: 11/23/2013 °Elsevier Interactive Patient Education ©2016  [employer] ° °

## 2015-07-26 NOTE — ED Notes (Signed)
Pt returns after being seen and diagnosed with kidney stones yesterday. States that pain improved and he awakened this morning with severe pain.

## 2015-07-26 NOTE — ED Provider Notes (Signed)
[facility] Emergency Department Provider Note  ____________________________________________  Time seen: 7:25 AM  I have reviewed the triage vital signs and the nursing notes.   HISTORY  Chief Complaint Flank Pain    HPI Donald Reid is a 30 y.o. male who complains of recurrent left flank pain for the last 2 days. He was seen in the emergency department 24 hours ago, diagnosed with renal colic and left ureteral stone of 4 mm by CT. Labs showed microscopic hematuria but normal creatinine and otherwise normal vital signs.After being treated with pain medicines in the emergency department he is feeling better and went home, slept for most the day. He did not pick up the antiemetic prescription that he was given but didn't get Flomax and Percocet from the pharmacy. When he woke up in the afternoon he felt the pain was recurring so he took the Flomax and Percocet and within an hour was feeling very nauseated. He had no nausea medicine and so has been unable to eat or drink much since yesterday evening. With that his pain is now very severe because she's been unable to tolerate more oral medication. No trauma or new symptoms. No fever or sweats. Pain is severe and nonradiating no aggravating or relieving factors.    Past Medical History  Diagnosis Date  . Cough LAST 2 DAYS    OCCASIONAL GREEN SPUTUM  . Kidney stone 6-7- YRS AGO    DID NOT PASS STONE     There are no active problems to display for this patient.    Past Surgical History  Procedure Laterality Date  . Inguinal hernia repair  05/30/2011    Procedure: HERNIA REPAIR INGUINAL ADULT;  Surgeon: Emelia LoronMatthew Wakefield, MD;  Location: [facility];  Service: General;  Laterality: Right;     Current Outpatient Rx  Name  Route  Sig  Dispense  Refill  . dextromethorphan-guaiFENesin (MUCINEX DM) 30-600 MG per 12 hr tablet   Oral   Take 1 tablet by mouth every 12 (twelve) hours.         . ondansetron (ZOFRAN  ODT) 4 MG disintegrating tablet   Oral   Take 1 tablet (4 mg total) by mouth every 8 (eight) hours as needed for nausea or vomiting.   20 tablet   0   . oxyCODONE-acetaminophen (PERCOCET/ROXICET) 5-325 MG tablet   Oral   Take 1 tablet by mouth every 4 (four) hours as needed for severe pain.   30 tablet   0   . tamsulosin (FLOMAX) 0.4 MG CAPS capsule   Oral   Take 1 capsule (0.4 mg total) by mouth daily after breakfast.   7 capsule   0      Allergies Review of patient's allergies indicates no known allergies.   History reviewed. No pertinent family history.  Social History Social History  Substance Use Topics  . Smoking status: Current Every Day Smoker -- 1.00 packs/day for 11 years    Types: Cigarettes  . Smokeless tobacco: Never Used  . Alcohol Use: No    Review of Systems  Constitutional:   No fever or chills.  ENT:   No sore throat. No rhinorrhea. Cardiovascular:   No chest pain. Respiratory:   No dyspnea or cough. Gastrointestinal:   Negative for abdominal pain, vomiting and diarrhea.  Genitourinary:   Negative for dysuria or difficulty urinating. Musculoskeletal:   Left flank pain as above. Neurological:   Negative for headaches 10-point ROS otherwise negative.  ____________________________________________  PHYSICAL EXAM:  VITAL SIGNS: ED Triage Vitals  Enc Vitals Group     BP 07/26/15 0709 127/87 mmHg     Pulse Rate 07/26/15 0709 65     Resp 07/26/15 0709 20     Temp 07/26/15 0709 97.4 F (36.3 C)     Temp Source 07/26/15 0709 Oral     SpO2 07/26/15 0709 100 %     Weight 07/26/15 0709 127 lb (57.607 kg)     Height 07/26/15 0709  (1.702 m)     Head Cir --      Peak Flow --      Pain Score 07/26/15 0710 10     Pain Loc --      Pain Edu? --      Excl. in GC? --     Vital signs reviewed, nursing assessments reviewed.   Constitutional:   Alert and oriented. Well appearing and in no distress. Eyes:   No scleral icterus. No  conjunctival pallor. PERRL. EOMI.  No nystagmus. ENT   Head:   Normocephalic and atraumatic.   Nose:   No congestion/rhinnorhea. No septal hematoma   Mouth/Throat:   MMM, no pharyngeal erythema. No peritonsillar mass.    Neck:   No stridor. No SubQ emphysema. No meningismus. Hematological/Lymphatic/Immunilogical:   No cervical lymphadenopathy. Cardiovascular:   RRR. Symmetric bilateral radial and DP pulses.  No murmurs.  Respiratory:   Normal respiratory effort without tachypnea nor retractions. Breath sounds are clear and equal bilaterally. No wheezes/rales/rhonchi. Gastrointestinal:   Soft and nontender. Non distended. There is no CVA tenderness.  No rebound, rigidity, or guarding. Genitourinary:   deferred Musculoskeletal:   Nontender with normal range of motion in all extremities. No joint effusions.  No lower extremity tenderness.  No edema. Neurologic:   Normal speech and language.  CN 2-10 normal. Motor grossly intact. No gross focal neurologic deficits are appreciated.  Skin:    Skin is warm, dry and intact. No rash noted.  No petechiae, purpura, or bullae.  ____________________________________________    LABS (pertinent positives/negatives) (all labs ordered are listed, but only abnormal results are displayed) Labs Reviewed - No data to display ____________________________________________   EKG    ____________________________________________    RADIOLOGY    ____________________________________________   PROCEDURES   ____________________________________________   INITIAL IMPRESSION / ASSESSMENT AND PLAN / ED COURSE  Pertinent labs & imaging results that were available during my care of the patient were reviewed by me and considered in my medical decision making (see chart for details).  Patient well appearing no acute distress, normal vital signs. Patient given IV Toradol and Zofran with resolution of pain. He is tolerating oral intake and able  to take oral Percocet. Zofran has been very effective for his symptoms. I encouraged the patient to fill his prescription that he was given yesterday and continue taking the medications as prescribed, anticipatory guidance given that he is likely to have ongoing symptoms over the next week until he is able to pass the stone. It's 4 mm without any change in creatinine and with no evidence of urinary tract infection and should pass uneventfully without urologic intervention. Patient encouraged to follow up with urology if his symptoms are worsening after a 2 or 3 days or if symptoms have not resolved in a week.     ____________________________________________   FINAL CLINICAL IMPRESSION(S) / ED DIAGNOSES  Final diagnoses:  Renal colic on left side  Nausea     Portions of this  note were generated with dragon dictation software. Dictation errors may occur despite best attempts at proofreading.   Sharman CheekPhillip Anquinette Pierro, MD 07/26/15 [id number]

## 2015-07-26 NOTE — ED Notes (Signed)
AAOx3.  Skin warm and dry. NAD.  Ambulates with easy and steady gait.   

## 2015-07-27 ENCOUNTER — Inpatient Hospital Stay
Admission: EM | Admit: 2015-07-27 | Discharge: 2015-07-29 | DRG: 694 | Disposition: A | Discharge status: Home / Self Care | Payer: Self-pay | Attending: Internal Medicine | Admitting: Internal Medicine

## 2015-07-27 ENCOUNTER — Emergency Department: Payer: Self-pay

## 2015-07-27 DIAGNOSIS — F17210 Nicotine dependence, cigarettes, uncomplicated: Secondary | ICD-10-CM | POA: Diagnosis present

## 2015-07-27 DIAGNOSIS — T40605A Adverse effect of unspecified narcotics, initial encounter: Secondary | ICD-10-CM | POA: Diagnosis present

## 2015-07-27 DIAGNOSIS — K5903 Drug induced constipation: Secondary | ICD-10-CM | POA: Diagnosis present

## 2015-07-27 DIAGNOSIS — N200 Calculus of kidney: Secondary | ICD-10-CM

## 2015-07-27 DIAGNOSIS — IMO0001 Reserved for inherently not codable concepts without codable children: Secondary | ICD-10-CM

## 2015-07-27 DIAGNOSIS — R52 Pain, unspecified: Secondary | ICD-10-CM | POA: Diagnosis present

## 2015-07-27 DIAGNOSIS — Z87442 Personal history of urinary calculi: Secondary | ICD-10-CM

## 2015-07-27 DIAGNOSIS — Z9889 Other specified postprocedural states: Secondary | ICD-10-CM

## 2015-07-27 DIAGNOSIS — Z79899 Other long term (current) drug therapy: Secondary | ICD-10-CM

## 2015-07-27 DIAGNOSIS — R112 Nausea with vomiting, unspecified: Secondary | ICD-10-CM | POA: Diagnosis present

## 2015-07-27 DIAGNOSIS — N202 Calculus of kidney with calculus of ureter: Principal | ICD-10-CM | POA: Diagnosis present

## 2015-07-27 LAB — CBC
HEMATOCRIT: 40.1 % (ref 40.0–52.0)
HEMOGLOBIN: 13.7 g/dL (ref 13.0–18.0)
MCH: 31.4 pg (ref 26.0–34.0)
MCHC: 34.2 g/dL (ref 32.0–36.0)
MCV: 91.7 fL (ref 80.0–100.0)
Platelets: 193 10*3/uL (ref 150–440)
RBC: 4.37 MIL/uL — ABNORMAL LOW (ref 4.40–5.90)
RDW: 13.3 % (ref 11.5–14.5)
WBC: 9.7 10*3/uL (ref 3.8–10.6)

## 2015-07-27 LAB — COMPREHENSIVE METABOLIC PANEL
ALT: 40 U/L (ref 17–63)
ANION GAP: 5 (ref 5–15)
AST: 38 U/L (ref 15–41)
Albumin: 4.1 g/dL (ref 3.5–5.0)
Alkaline Phosphatase: 70 U/L (ref 38–126)
BUN: 14 mg/dL (ref 6–20)
CHLORIDE: 107 mmol/L (ref 101–111)
CO2: 26 mmol/L (ref 22–32)
Calcium: 8.9 mg/dL (ref 8.9–10.3)
Creatinine, Ser: 1.11 mg/dL (ref 0.61–1.24)
GFR calc non Af Amer: >60 mL/min (ref >60)
Glucose, Bld: 96 mg/dL (ref 65–99)
Potassium: 3.8 mmol/L (ref 3.5–5.1)
SODIUM: 138 mmol/L (ref 135–145)
Total Bilirubin: 0.4 mg/dL (ref 0.3–1.2)
Total Protein: 6.5 g/dL (ref 6.5–8.1)

## 2015-07-27 LAB — URINALYSIS COMPLETE WITH MICROSCOPIC (ARMC ONLY)
BACTERIA UA: NONE SEEN
Bilirubin Urine: NEGATIVE
GLUCOSE, UA: NEGATIVE mg/dL
Leukocytes, UA: NEGATIVE
Nitrite: NEGATIVE
PROTEIN: NEGATIVE mg/dL
Specific Gravity, Urine: 1.024 (ref 1.005–1.030)
Squamous Epithelial / LPF: NONE SEEN
pH: 5 (ref 5.0–8.0)

## 2015-07-27 MED ORDER — SODIUM CHLORIDE 0.9 % IV SOLN
INTRAVENOUS | Status: DC
  Administered 2015-07-27 – 2015-07-29 (×6): via INTRAVENOUS

## 2015-07-27 MED ORDER — ONDANSETRON HCL 4 MG/2ML IJ SOLN
INTRAMUSCULAR | Status: AC
  Administered 2015-07-27: 4 mg via INTRAVENOUS
  Filled 2015-07-27: qty 2

## 2015-07-27 MED ORDER — TAMSULOSIN HCL 0.4 MG PO CAPS
0.4000 mg | ORAL_CAPSULE | Freq: Every day | ORAL | Status: DC
  Administered 2015-07-28 – 2015-07-29 (×2): 0.4 mg via ORAL
  Filled 2015-07-27 (×2): qty 1

## 2015-07-27 MED ORDER — SODIUM CHLORIDE 0.9% FLUSH: 3.0000 mL | Freq: Two times a day (BID) | INTRAVENOUS | Status: DC

## 2015-07-27 MED ORDER — SODIUM CHLORIDE 0.9 % IV BOLUS (SEPSIS)
1000.0000 mL | Freq: Once | INTRAVENOUS | Status: AC
  Administered 2015-07-27: 1000 mL via INTRAVENOUS

## 2015-07-27 MED ORDER — KETOROLAC TROMETHAMINE 30 MG/ML IJ SOLN: 30.0000 mg | Freq: Once | INTRAMUSCULAR | Status: DC

## 2015-07-27 MED ORDER — KETOROLAC TROMETHAMINE 60 MG/2ML IM SOLN
INTRAMUSCULAR | Status: AC
  Administered 2015-07-27: 30 mg
  Filled 2015-07-27: qty 2

## 2015-07-27 MED ORDER — HYDROMORPHONE HCL 1 MG/ML IJ SOLN
1.0000 mg | Freq: Once | INTRAMUSCULAR | Status: AC
  Administered 2015-07-27: 1 mg via INTRAVENOUS
  Filled 2015-07-27: qty 1

## 2015-07-27 MED ORDER — ACETAMINOPHEN 325 MG PO TABS
650.0000 mg | ORAL_TABLET | Freq: Four times a day (QID) | ORAL | Status: DC | PRN
  Administered 2015-07-28: 650 mg via ORAL
  Filled 2015-07-27: qty 2

## 2015-07-27 MED ORDER — ONDANSETRON HCL 4 MG/2ML IJ SOLN
4.0000 mg | Freq: Once | INTRAMUSCULAR | Status: AC
  Administered 2015-07-27: 4 mg via INTRAVENOUS

## 2015-07-27 MED ORDER — ACETAMINOPHEN 650 MG RE SUPP: 650.0000 mg | Freq: Four times a day (QID) | RECTAL | Status: DC | PRN

## 2015-07-27 MED ORDER — HYDROMORPHONE HCL 1 MG/ML IJ SOLN
0.5000 mg | INTRAMUSCULAR | Status: DC | PRN
Start: 2015-07-27 — End: 2015-07-28

## 2015-07-27 MED ORDER — ONDANSETRON HCL 4 MG PO TABS: 4.0000 mg | ORAL_TABLET | Freq: Four times a day (QID) | ORAL | Status: DC | PRN

## 2015-07-27 MED ORDER — ENOXAPARIN SODIUM 40 MG/0.4ML ~~LOC~~ SOLN
40.0000 mg | SUBCUTANEOUS | Status: DC
Start: 2015-07-27 — End: 2015-07-29
  Filled 2015-07-27: qty 0.4

## 2015-07-27 MED ORDER — ONDANSETRON HCL 4 MG/2ML IJ SOLN: 4.0000 mg | Freq: Four times a day (QID) | INTRAMUSCULAR | Status: DC | PRN

## 2015-07-27 NOTE — ED Notes (Signed)
Patient states that he was seen in the ED Wednesday morning and diagnosed with Kidney stones. Patient states that he has not passed the stones yet. Patient states that the oxycodone is not helping with the pain and eventually the pain gets so bad that he starts vomiting. Patient is also taking Ibuprofen for pain.

## 2015-07-27 NOTE — ED Notes (Signed)
Admitting MD at bedside.

## 2015-07-27 NOTE — ED Notes (Signed)
MD McShane notified pt's pain/nausea trending up

## 2015-07-27 NOTE — ED Notes (Signed)
Pt states that he has been seen for the past couple days with the diagnosis of kidney stones, pt states that he cont to vomit and has been unable to hold down his medications pt appears uncomfortable and squats while being registered in attempt to relieve his pain

## 2015-07-27 NOTE — ED Notes (Signed)
[place]  Patient down on floor curled up in a ball due to pain.  [facility] and IVF started per protocol.  Continue to monitor.

## 2015-07-27 NOTE — ED Provider Notes (Signed)
[facility] Emergency Department Provider Note  ____________________________________________   I have reviewed the triage vital signs and the nursing notes.   HISTORY  Chief Complaint Nephrolithiasis    HPI Donald Reid is a 30 y.o. male  w kidney stone dx 2 days ago. Has had to come back for pain control. Here again for pain control . Has had vomiting. Patient has had adequate analgesia in terms of prescriptions at home but is not enough to keep him pain controlled. Patient is a 4 x 4 x 5 stone in the proximal UVJ. Has not had any fever. Has had vomiting. Pain persists in the left flank . Try to follow up with urology today but they were not able to get in to the office.     Past Medical History  Diagnosis Date  . Cough LAST 2 DAYS    OCCASIONAL GREEN SPUTUM  . Kidney stone 6-7- YRS AGO    DID NOT PASS STONE    There are no active problems to display for this patient.   Past Surgical History  Procedure Laterality Date  . Inguinal hernia repair  05/30/2011    Procedure: HERNIA REPAIR INGUINAL ADULT;  Surgeon: Emelia LoronMatthew Wakefield, MD;  Location: [facility];  Service: General;  Laterality: Right;    Current Outpatient Rx  Name  Route  Sig  Dispense  Refill  . dextromethorphan-guaiFENesin (MUCINEX DM) 30-600 MG per 12 hr tablet   Oral   Take 1 tablet by mouth every 12 (twelve) hours.         . ondansetron (ZOFRAN ODT) 4 MG disintegrating tablet   Oral   Take 1 tablet (4 mg total) by mouth every 8 (eight) hours as needed for nausea or vomiting.   20 tablet   0   . oxyCODONE-acetaminophen (PERCOCET/ROXICET) 5-325 MG tablet   Oral   Take 1 tablet by mouth every 4 (four) hours as needed for severe pain.   30 tablet   0   . tamsulosin (FLOMAX) 0.4 MG CAPS capsule   Oral   Take 1 capsule (0.4 mg total) by mouth daily after breakfast.   7 capsule   0     Allergies Review of patient's allergies indicates no known allergies.  No family history  on file.  Social History Social History  Substance Use Topics  . Smoking status: Current Every Day Smoker -- 1.00 packs/day for 11 years    Types: Cigarettes  . Smokeless tobacco: Never Used  . Alcohol Use: No    Review of Systems Constitutional: No fever/chills Eyes: No visual changes. ENT: No sore throat. No stiff neck no neck pain Cardiovascular: Denies chest pain. Respiratory: Denies shortness of breath. Gastrointestinal:   Positive vomiting.  No diarrhea.  No constipation. Genitourinary: Negative for dysuria. Musculoskeletal: Negative lower extremity swelling Skin: Negative for rash. Neurological: Negative for headaches, focal weakness or numbness. 10-point ROS otherwise negative.  ____________________________________________   PHYSICAL EXAM:  VITAL SIGNS: ED Triage Vitals  Enc Vitals Group     BP 07/27/15 1903 128/83 mmHg     Pulse Rate 07/27/15 1903 83     Resp 07/27/15 1930 17     Temp --      Temp src --      SpO2 07/27/15 1903 100 %     Weight --      Height --      Head Cir --      Peak Flow --  Pain Score 07/27/15 1650 8     Pain Loc --      Pain Edu? --      Excl. in GC? --     Constitutional: Alert and oriented. Well appearing and in no acute distress. Eyes: Conjunctivae are normal. PERRL. EOMI. Head: Atraumatic. Nose: No congestion/rhinnorhea. Mouth/Throat: Mucous membranes are moist.  Oropharynx non-erythematous. Neck: No stridor.   Nontender with no meningismus Cardiovascular: Normal rate, regular rhythm. Grossly normal heart sounds.  Good peripheral circulation. Respiratory: Normal respiratory effort.  No retractions. Lungs CTAB. Abdominal: Soft and nontender. No distention. No guarding no rebound Back:  There is no focal tenderness or step off there is no midline tenderness there are no lesions noted. there is Left-sided CVA tenderness Musculoskeletal: No lower extremity tenderness. No joint effusions, no DVT signs strong distal pulses  no edema Neurologic:  Normal speech and language. No gross focal neurologic deficits are appreciated.  Skin:  Skin is warm, dry and intact. No rash noted. Psychiatric: Mood and affect are normal. Speech and behavior are normal.  ____________________________________________   LABS (all labs ordered are listed, but only abnormal results are displayed)  Labs Reviewed  CBC - Abnormal; Notable for the following:    RBC 4.37 (*)    All other components within normal limits  URINALYSIS COMPLETEWITH MICROSCOPIC ([facility] ONLY) - Abnormal; Notable for the following:    Color, Urine YELLOW (*)    APPearance CLEAR (*)    Ketones, ur 1+ (*)    Hgb urine dipstick 3+ (*)    All other components within normal limits  COMPREHENSIVE METABOLIC PANEL   ____________________________________________  EKG  I personally interpreted any EKGs ordered by me or triage  ____________________________________________  RADIOLOGY  I reviewed any imaging ordered by me or triage that were performed during my shift and, if possible, patient and/or family made aware of any abnormal findings. ____________________________________________   PROCEDURES  Procedure(s) performed: None  Critical Care performed: None  ____________________________________________   INITIAL IMPRESSION / ASSESSMENT AND PLAN / ED COURSE  Pertinent labs & imaging results that were available during my care of the patient were reviewed by me and considered in my medical decision making (see chart for details).  Patient presents today with ongoing pain from kidney stone, discussed with Dr. Annabell HowellsWrenn, who asked for a KUB, does not feel the patient is to have an emergent procedures and epididymis be to admit him for pain control which we will do. They will follow along over the weekend. We'll call the hospitalist for admission. ____________________________________________   FINAL CLINICAL IMPRESSION(S) / ED DIAGNOSES  Final diagnoses:   Stone (use calc)      This chart was dictated using voice recognition software.  Despite best efforts to proofread,  errors can occur which can change meaning.     Jeanmarie PlantJames A McShane, MD 07/27/15 2013

## 2015-07-27 NOTE — ED Notes (Signed)
Called floor to ask status of bed

## 2015-07-27 NOTE — H&P (Signed)
[facility] [place] [facility]   PATIENT NAME: Donald RouteBrett Reid    MR#:  [id number]  DATE OF BIRTH:  January 03, 1986  DATE OF ADMISSION:  07/27/2015  PRIMARY CARE PHYSICIAN: No primary care provider on file.   REQUESTING/REFERRING PHYSICIAN: Alphonzo LemmingsMcShane, MD  CHIEF COMPLAINT:   Chief Complaint  Patient presents with  . Nephrolithiasis    HISTORY OF PRESENT ILLNESS:  Donald Reid  is a 30 y.o. male who presents with Persistent pain from kidney stone. Patient has been to the ED 3 times now for the past couple days. He has a known left renal stone 4 x 4 x 5 mm. He has been unable to pass the stone. Hospitalists were called for admission with urology for support.  PAST MEDICAL HISTORY:   Past Medical History  Diagnosis Date  . Cough LAST 2 DAYS    OCCASIONAL GREEN SPUTUM  . Kidney stone 6-7- YRS AGO    DID NOT PASS STONE    PAST SURGICAL HISTORY:   Past Surgical History  Procedure Laterality Date  . Inguinal hernia repair  05/30/2011    Procedure: HERNIA REPAIR INGUINAL ADULT;  Surgeon: Emelia LoronMatthew Wakefield, MD;  Location: [facility];  Service: General;  Laterality: Right;    SOCIAL HISTORY:   Social History  Substance Use Topics  . Smoking status: Current Every Day Smoker -- 1.00 packs/day for 11 years    Types: Cigarettes  . Smokeless tobacco: Never Used  . Alcohol Use: No    FAMILY HISTORY:  No family history on file.  DRUG ALLERGIES:  No Known Allergies  MEDICATIONS AT HOME:   Prior to Admission medications   Medication Sig Start Date End Date Taking? Authorizing Provider  ibuprofen (ADVIL,MOTRIN) 200 MG tablet Take 200-600 mg by mouth every 6 (six) hours as needed for fever, headache or mild pain.   Yes Historical Provider, MD  ondansetron (ZOFRAN ODT) 4 MG disintegrating tablet Take 1 tablet (4 mg total) by mouth every 8 (eight) hours as needed for nausea or vomiting. 07/25/15  Yes Darci Currentandolph N Brown, MD  oxyCODONE-acetaminophen (PERCOCET/ROXICET)  5-325 MG tablet Take 1 tablet by mouth every 4 (four) hours as needed for severe pain. 07/25/15  Yes Darci Currentandolph N Brown, MD  tamsulosin [facility](FLOMAX) 0.4 MG CAPS capsule Take 1 capsule (0.4 mg total) by mouth daily after breakfast. 07/25/15 07/31/15 Yes Darci Currentandolph N Brown, MD    REVIEW OF SYSTEMS:  Review of Systems  Constitutional: Negative for fever, chills, weight loss and malaise/fatigue.  HENT: Negative for ear pain, hearing loss and tinnitus.   Eyes: Negative for blurred vision, double vision, pain and redness.  Respiratory: Negative for cough, hemoptysis and shortness of breath.   Cardiovascular: Negative for chest pain, palpitations, orthopnea and leg swelling.  Gastrointestinal: Positive for nausea, vomiting and abdominal pain. Negative for diarrhea and constipation.  Genitourinary: Positive for dysuria. Negative for frequency and hematuria.  Musculoskeletal: Negative for back pain, joint pain and neck pain.  Skin:       No acne, rash, or lesions  Neurological: Negative for dizziness, tremors, focal weakness and weakness.  Endo/Heme/Allergies: Negative for polydipsia. Does not bruise/bleed easily.  Psychiatric/Behavioral: Negative for depression. The patient is not nervous/anxious and does not have insomnia.      VITAL SIGNS:   Filed Vitals:   07/27/15 1903 07/27/15 1930  BP: 128/83 131/79  Pulse: 83 64  Resp:  17  SpO2: 100% 100%   Wt Readings from Last 3 Encounters:  07/26/15  57.607 kg (127 lb)  07/25/15 57.607 kg (127 lb)  06/26/11 59.421 kg (131 lb)    PHYSICAL EXAMINATION:  Physical Exam  Vitals reviewed. Constitutional: He is oriented to person, place, and time. He appears well-developed and well-nourished. No distress.  HENT:  Head: Normocephalic and atraumatic.  Mouth/Throat: Oropharynx is clear and moist.  Eyes: Conjunctivae and EOM are normal. Pupils are equal, round, and reactive to light. No scleral icterus.  Neck: Normal range of motion. Neck supple. No JVD  present. No thyromegaly present.  Cardiovascular: Normal rate, regular rhythm and intact distal pulses.  Exam reveals no gallop and no friction rub.   No murmur heard. Respiratory: Effort normal and breath sounds normal. No respiratory distress. He has no wheezes. He has no rales.  GI: Soft. Bowel sounds are normal. He exhibits no distension. There is tenderness (left lower quadrant).  Musculoskeletal: Normal range of motion. He exhibits no edema.  No arthritis, no gout  Lymphadenopathy:    He has no cervical adenopathy.  Neurological: He is alert and oriented to person, place, and time. No cranial nerve deficit.  No dysarthria, no aphasia  Skin: Skin is warm and dry. No rash noted. No erythema.  Psychiatric: He has a normal mood and affect. His behavior is normal. Judgment and thought content normal.    LABORATORY PANEL:   CBC  Recent Labs Lab 07/27/15 1653  WBC 9.7  HGB 13.7  HCT 40.1  PLT 193   ------------------------------------------------------------------------------------------------------------------  Chemistries   Recent Labs Lab 07/27/15 1653  NA 138  K 3.8  CL 107  CO2 26  GLUCOSE 96  BUN 14  CREATININE 1.11  CALCIUM 8.9  AST 38  ALT 40  ALKPHOS 70  BILITOT 0.4   ------------------------------------------------------------------------------------------------------------------  Cardiac Enzymes No results for input(s): TROPONINI in the last 168 hours. ------------------------------------------------------------------------------------------------------------------  RADIOLOGY:  Dg Abd 1 View  07/27/2015  CLINICAL DATA:  Re-evaluate left renal stone. EXAM: ABDOMEN - 1 VIEW COMPARISON:  CT scan July 25, 2015 FINDINGS: The known renal stone has migrated inferiorly now projected over the superior aspect of the L5 left transverse process. Phleboliths are seen in the pelvis. No other acute abnormalities. IMPRESSION: Interval migration of the known left renal  stone now projected over the superior aspect of the left L5 transverse process. Electronically Signed   By: Gerome Samavid  Williams III M.D   On: 07/27/2015 20:18    EKG:  No orders found for this or any previous visit.  IMPRESSION AND PLAN:  Principal Problem:   Renal calculus, left - admitted with pain and nausea control. Fluids, continue Flomax, urology consult. Active Problems:   Intractable pain - pain control as above   Nausea and vomiting - when necessary antiemetics as above  All the records are reviewed and case discussed with ED provider. Management plans discussed with the patient and/or family.  DVT PROPHYLAXIS: SubQ lovenox  GI PROPHYLAXIS: None  ADMISSION STATUS: Inpatient  CODE STATUS: Full Code Status History    This patient does not have a recorded code status. Please follow your organizational policy for patients in this situation.      TOTAL TIME TAKING CARE OF THIS PATIENT: 45 minutes.    Densil Ottey FIELDING 07/27/2015, 8:58 PM  [facility]  Office  [phone]  CC: Primary care physician; No primary care provider on file.

## 2015-07-27 NOTE — ED Notes (Signed)
Called floor to let them know pt on the way 

## 2015-07-28 LAB — BASIC METABOLIC PANEL
Anion gap: 5 (ref 5–15)
BUN: 14 mg/dL (ref 6–20)
CHLORIDE: 110 mmol/L (ref 101–111)
CO2: 24 mmol/L (ref 22–32)
CREATININE: 0.89 mg/dL (ref 0.61–1.24)
Calcium: 8 mg/dL — ABNORMAL LOW (ref 8.9–10.3)
GFR calc Af Amer: >60 mL/min (ref >60)
GFR calc non Af Amer: >60 mL/min (ref >60)
GLUCOSE: 98 mg/dL (ref 65–99)
Potassium: 3.4 mmol/L — ABNORMAL LOW (ref 3.5–5.1)
SODIUM: 139 mmol/L (ref 135–145)

## 2015-07-28 LAB — CBC
HCT: 36.5 % — ABNORMAL LOW (ref 40.0–52.0)
HEMOGLOBIN: 12.8 g/dL — AB (ref 13.0–18.0)
MCH: 31.8 pg (ref 26.0–34.0)
MCHC: 35 g/dL (ref 32.0–36.0)
MCV: 90.8 fL (ref 80.0–100.0)
Platelets: 167 10*3/uL (ref 150–440)
RBC: 4.01 MIL/uL — ABNORMAL LOW (ref 4.40–5.90)
RDW: 13.7 % (ref 11.5–14.5)
WBC: 7.9 10*3/uL (ref 3.8–10.6)

## 2015-07-28 MED ORDER — HYDROMORPHONE HCL 2 MG PO TABS: 4.0000 mg | ORAL_TABLET | ORAL | Status: DC | PRN

## 2015-07-28 MED ORDER — IBUPROFEN 400 MG PO TABS
400.0000 mg | ORAL_TABLET | Freq: Four times a day (QID) | ORAL | Status: DC | PRN
  Administered 2015-07-28: 400 mg via ORAL
  Filled 2015-07-28: qty 1

## 2015-07-28 MED ORDER — POLYETHYLENE GLYCOL 3350 17 G PO PACK
17.0000 g | PACK | Freq: Once | ORAL | Status: AC
  Administered 2015-07-28: 17 g via ORAL
  Filled 2015-07-28: qty 1

## 2015-07-28 MED ORDER — NICOTINE 14 MG/24HR TD PT24
14.0000 mg | MEDICATED_PATCH | Freq: Every day | TRANSDERMAL | Status: DC
  Administered 2015-07-28 – 2015-07-29 (×2): 14 mg via TRANSDERMAL
  Filled 2015-07-28 (×2): qty 1

## 2015-07-28 MED ORDER — SENNOSIDES-DOCUSATE SODIUM 8.6-50 MG PO TABS
1.0000 | ORAL_TABLET | Freq: Two times a day (BID) | ORAL | Status: DC
  Administered 2015-07-28 (×2): 1 via ORAL
  Filled 2015-07-28 (×3): qty 1

## 2015-07-28 MED ORDER — OXYCODONE-ACETAMINOPHEN 7.5-325 MG PO TABS: 1.0000 | ORAL_TABLET | ORAL | Status: DC | PRN

## 2015-07-28 NOTE — Progress Notes (Signed)
Pt arrived on unit. VSS, cardiac monitoring was applied. Skin assessment was completed with Alvis Lemmingsawn, RN.  Karsten RoLauren E Hobbs

## 2015-07-28 NOTE — Progress Notes (Signed)
Dr. Elpidio AnisSudini notified of complaint of headache despite tylenol administration. 400 mg motrin q6 prn ordered.

## 2015-07-28 NOTE — Progress Notes (Signed)
[facility] [place] [facility]   PATIENT NAME: Donald RouteBrett Reid    MR#:  [id number]  DATE OF BIRTH:  [birth year]  SUBJECTIVE:  CHIEF COMPLAINT:   Chief Complaint  Patient presents with  . Nephrolithiasis   Pain has resolved. No vomiting.  REVIEW OF SYSTEMS:    Review of Systems  Constitutional: Negative for fever, chills, weight loss and malaise/fatigue.  HENT: Negative for hearing loss and nosebleeds.   Eyes: Negative for blurred vision, double vision and pain.  Respiratory: Negative for cough, hemoptysis, sputum production, shortness of breath and wheezing.   Cardiovascular: Negative for chest pain, palpitations, orthopnea and leg swelling.  Gastrointestinal: Negative for nausea, vomiting, abdominal pain, diarrhea and constipation.  Genitourinary: Negative for dysuria and hematuria.  Musculoskeletal: Negative for myalgias, back pain and falls.  Skin: Negative for rash.  Neurological: Negative for dizziness, tremors, sensory change, speech change, focal weakness, seizures and headaches.  Endo/Heme/Allergies: Does not bruise/bleed easily.  Psychiatric/Behavioral: Negative for depression and memory loss. The patient is not nervous/anxious.     DRUG ALLERGIES:  No Known Allergies  VITALS:  Blood pressure 116/77, pulse 77, temperature 98.1 F (36.7 C), temperature source Oral, resp. rate 18, height 5\' 7"  (1.702 m), weight 61.1 kg (134 lb 11.2 oz), SpO2 98 %.  PHYSICAL EXAMINATION:   Physical Exam  GENERAL:  30 y.o.-year-old patient lying in the bed with no acute distress.  EYES: Pupils equal, round, reactive to light and accommodation. No scleral icterus. Extraocular muscles intact.  HEENT: Head atraumatic, normocephalic. Oropharynx and nasopharynx clear.  NECK:  Supple, no jugular venous distention. No thyroid enlargement, no tenderness.  LUNGS: Normal breath sounds bilaterally, no wheezing, rales, rhonchi. No use of accessory muscles of  respiration.  CARDIOVASCULAR: S1, S2 normal. No murmurs, rubs, or gallops.  ABDOMEN: Soft, nontender, nondistended. Bowel sounds present. No organomegaly or mass.  EXTREMITIES: No cyanosis, clubbing or edema b/l.    NEUROLOGIC: Cranial nerves II through XII are intact. No focal Motor or sensory deficits b/l.   PSYCHIATRIC: The patient is alert and oriented x 3.  SKIN: No obvious rash, lesion, or ulcer.   LABORATORY PANEL:   CBC  Recent Labs Lab 07/28/15 0601  WBC 7.9  HGB 12.8*  HCT 36.5*  PLT 167   ------------------------------------------------------------------------------------------------------------------ Chemistries   Recent Labs Lab 07/27/15 1653 07/28/15 0601  NA 138 139  K 3.8 3.4*  CL 107 110  CO2 26 24  GLUCOSE 96 98  BUN 14 14  CREATININE 1.11 0.89  CALCIUM 8.9 8.0*  AST 38  --   ALT 40  --   ALKPHOS 70  --   BILITOT 0.4  --    ------------------------------------------------------------------------------------------------------------------  Cardiac Enzymes No results for input(s): TROPONINI in the last 168 hours. ------------------------------------------------------------------------------------------------------------------  RADIOLOGY:  Dg Abd 1 View  07/27/2015  CLINICAL DATA:  Re-evaluate left renal stone. EXAM: ABDOMEN - 1 VIEW COMPARISON:  CT scan July 25, 2015 FINDINGS: The known renal stone has migrated inferiorly now projected over the superior aspect of the L5 left transverse process. Phleboliths are seen in the pelvis. No other acute abnormalities. IMPRESSION: Interval migration of the known left renal stone now projected over the superior aspect of the left L5 transverse process. Electronically Signed   By: Gerome Samavid  Williams III M.D   On: 07/27/2015 20:18     ASSESSMENT AND PLAN:   Renal calculus, left - admitted with pain and nausea control. Fluids, continue Flomax. Discussed with Dr. Selena BattenKim  of urology. Continue hydration. Try oral pain  medications. Monitor patient as he may pass the stone. If he continues to have pain or doesn't pass the stone will need long-term plan.  Constipation- due to narcotics. Start laxatives  All the records are reviewed and case discussed with Care Management/Social Workerr. Management plans discussed with the patient, family and they are in agreement.  CODE STATUS: FULL  DVT Prophylaxis: SCDs  TOTAL TIME TAKING CARE OF THIS PATIENT: 30 minutes.   POSSIBLE D/C IN 1-2 DAYS, DEPENDING ON CLINICAL CONDITION.  Milagros LollSudini, Christmas Faraci R M.D on 07/28/2015 at 1:43 PM  Between 7am to 6pm - Pager - [phone]  After 6pm go to [email or web address] - password [email or web address]  [facility] [place]  Office  [phone]  CC: Primary care physician; No primary care provider on file.  Note: This dictation was prepared with Dragon dictation along with smaller phrase technology. Any transcriptional errors that result from this process are unintentional.

## 2015-07-28 NOTE — Discharge Instructions (Addendum)
Regular diet  Regular activity  Follow up with Urology Kidney Stones Kidney stones (urolithiasis) are deposits that form inside your kidneys. The intense pain is caused by the stone moving through the urinary tract. When the stone moves, the ureter goes into spasm around the stone. The stone is usually passed in the urine.  CAUSES   A disorder that makes certain neck glands produce too much parathyroid hormone (primary hyperparathyroidism).  A buildup of uric acid crystals, similar to gout in your joints.  Narrowing (stricture) of the ureter.  A kidney obstruction present at birth (congenital obstruction).  Previous surgery on the kidney or ureters.  Numerous kidney infections. SYMPTOMS   Feeling sick to your stomach (nauseous).  Throwing up (vomiting).  Blood in the urine (hematuria).  Pain that usually spreads (radiates) to the groin.  Frequency or urgency of urination. DIAGNOSIS   Taking a history and physical exam.  Blood or urine tests.  CT scan.  Occasionally, an examination of the inside of the urinary bladder (cystoscopy) is performed. TREATMENT   Observation.  Increasing your fluid intake.  Extracorporeal shock wave lithotripsy--This is a noninvasive procedure that uses shock waves to break up kidney stones.  Surgery may be needed if you have severe pain or persistent obstruction. There are various surgical procedures. Most of the procedures are performed with the use of small instruments. Only small incisions are needed to accommodate these instruments, so recovery time is minimized. The size, location, and chemical composition are all important variables that will determine the proper choice of action for you. Talk to your health care provider to better understand your situation so that you will minimize the risk of injury to yourself and your kidney.  HOME CARE INSTRUCTIONS   Drink enough water and fluids to keep your urine clear or pale yellow. This  will help you to pass the stone or stone fragments.  Strain all urine through the provided strainer. Keep all particulate matter and stones for your health care provider to see. The stone causing the pain may be as small as a grain of salt. It is very important to use the strainer each and every time you pass your urine. The collection of your stone will allow your health care provider to analyze it and verify that a stone has actually passed. The stone analysis will often identify what you can do to reduce the incidence of recurrences.  Only take over-the-counter or prescription medicines for pain, discomfort, or fever as directed by your health care provider.  Keep all follow-up visits as told by your health care provider. This is important.  Get follow-up X-rays if required. The absence of pain does not always mean that the stone has passed. It may have only stopped moving. If the urine remains completely obstructed, it can cause loss of kidney function or even complete destruction of the kidney. It is your responsibility to make sure X-rays and follow-ups are completed. Ultrasounds of the kidney can show blockages and the status of the kidney. Ultrasounds are not associated with any radiation and can be performed easily in a matter of minutes.  Make changes to your daily diet as told by your health care provider. You may be told to:  Limit the amount of salt that you eat.  Eat 5 or more servings of fruits and vegetables each day.  Limit the amount of meat, poultry, fish, and eggs that you eat.  Collect a 24-hour urine sample as told by your health care  provider.You may need to collect another urine sample every 6-12 months. SEEK MEDICAL CARE IF:  You experience pain that is progressive and unresponsive to any pain medicine you have been prescribed. SEEK IMMEDIATE MEDICAL CARE IF:   Pain cannot be controlled with the prescribed medicine.  You have a fever or shaking chills.  The  severity or intensity of pain increases over 18 hours and is not relieved by pain medicine.  You develop a new onset of abdominal pain.  You feel faint or pass out.  You are unable to urinate.   This information is not intended to replace advice given to you by your health care provider. Make sure you discuss any questions you have with your health care provider.   Document Released: 01/13/2005 Document Revised: 10/04/2014 Document Reviewed: 06/16/2012 Elsevier Interactive Patient Education [employer]2016 [employer]

## 2015-07-28 NOTE — Consult Note (Signed)
Urology Consult  Referring physician: S. Sudini, MD ([place]) Reason for referral: Ureteral Stone  Chief Complaint: Recurrent Colic  History of Present Illness: 30 y.o. WM with h/o stone 11 yrs ago (never passed, but pain resolved), who was in his usual state of general good health until 07/25/2015 when he awoken from sleep at 3am with the acute onset of left flank pain (dull ache, 10/10) associated with nausea but no emesis. Pt presented to the [facility] ER and was found to have a 4x4x16m stone in the left proximal ureter (L3) with moderate hydro.  The pt was controlled with IV Fentanyl and the pt discharged with po Percocet (5/325), 1 po q4hrs prn, ibuprofen 200-603mpo q6hrs prn, tamsulosin 0.23m21mo qDay after breakfast, Zofran 23mg65mT q8hrs prn, and f/u with Dr. McDiarmid of [facility].  Pt did not have any recurrent colic until the next morning when he awoken from sleep at 4am with left flank pain (dull ache, 10/10) prompting the [facility] The pain resolved with IV Toradol and the pt was d/c'd home to continue his trial of passage.  Pt did not have any recurrent colic until yesterday morning at 10 am, while at work (LLQ pain, sharp, 10/10).  He took a PercAmbulance person went home.  The pain persisted and the pt took another Percocet at 1pm without improvement.  The pt developed N/V, refractory to Zofran, and [facility]  The pt was afebrile, with stable VSS, nL WBC, nL Creatinine. A KUB was obtained which reveal the stone to have migrated to the lower proximal ureter, just superior to the left L5 transverse process.  The [facility].  Since admission last night, the pt has been pain free.  The pt denies dysuria, gross hematuria, F/C.  Past Medical History  Diagnosis Date  . Cough LAST 2 DAYS    OCCASIONAL GREEN SPUTUM  . Kidney stone 6-7- YRS AGO    DID NOT PASS STONE   Past  Surgical History  Procedure Laterality Date  . Inguinal hernia repair  05/30/2011    Procedure: HERNIA REPAIR INGUINAL ADULT;  Surgeon: MattRolm Bookbinder;  Location: [facility];  Service: General;  Laterality: Right;   FH: positive for stones in Mother and Father SH: Positive for active tobacco use (1ppd x 15 yrs) - cessation encouraged        EtOH: occas use (denies h/o dependence or XS)  Medications:  I have reviewed the patient's current medications. Prior to Admission:  Prescriptions prior to admission  Medication Sig Dispense Refill Last Dose  . ibuprofen (ADVIL,MOTRIN) 200 MG tablet Take 200-600 mg by mouth every 6 (six) hours as needed for fever, headache or mild pain.   07/27/2015 at 1500  . ondansetron (ZOFRAN ODT) 4 MG disintegrating tablet Take 1 tablet (4 mg total) by mouth every 8 (eight) hours as needed for nausea or vomiting. 20 tablet 0 07/27/2015 at Unknown time  . oxyCODONE-acetaminophen (PERCOCET/ROXICET) 5-325 MG tablet Take 1 tablet by mouth every 4 (four) hours as needed for severe pain. 30 tablet 0 07/27/2015 at 1400  . tamsulosin (FLOMAX) 0.4 MG CAPS capsule Take 1 capsule (0.4 mg total) by mouth daily after breakfast. 7 capsule 0 07/27/2015 at Unknown time   Scheduled: . enoxaparin (LOVENOX) injection  40 mg Subcutaneous Q24H  . ketorolac  30 mg Intravenous Once  . senna-docusate  1  tablet Oral BID  . sodium chloride flush  3 mL Intravenous Q12H  . tamsulosin  0.4 mg Oral QPC breakfast   LNL:GXQJJHERDEYCX **OR** acetaminophen, HYDROmorphone, ondansetron **OR** ondansetron (ZOFRAN) IV Allergies: No Known Allergies  History reviewed. No pertinent family history. Social History:  reports that he has been smoking Cigarettes.  He has a 11 pack-year smoking history. He has never used smokeless tobacco. He reports that he uses illicit drugs (Marijuana). He reports that he does not drink alcohol.  ROS : as per the HPI; remaining 10 system ROS negative   Physical Exam:   Vital signs in last 24 hours: Temp:  [98.1 F (36.7 C)-98.3 F (36.8 C)] 98.1 F (36.7 C) (07/01 0519) Pulse Rate:  [60-83] 77 (07/01 0519) Resp:  [16-18] 18 (07/01 0519) BP: (116-137)/(77-87) 116/77 mmHg (07/01 0519) SpO2:  [92 %-100 %] 98 % (07/01 0519) Weight:  [61.1 kg (134 lb 11.2 oz)] 61.1 kg (134 lb 11.2 oz) (06/30 2153)   Physical Exam  General: WDWN WM in NAD Skin/Lymph: no lesions about the Head/Neck, no cervical/supraclavicular adenopathy HEENT: /AT, EOMI, Anicteric Neck: no masses, no bruits Chest: CTA, nL Effort COR: RRR w/out M/G/R, 2+ Carotid/Radial b/L Abd: soft, NT/ND, NABS GU: deferred Ext: no edema Neuro: non-focal Psych: A&O x4  Laboratory Data:  Results for orders placed or performed during the hospital encounter of 07/27/15 (from the past 72 hour(s))  Comprehensive metabolic panel     Status: None   Collection Time: 07/27/15  4:53 PM  Result Value Ref Range   Sodium 138 135 - 145 mmol/L   Potassium 3.8 3.5 - 5.1 mmol/L   Chloride 107 101 - 111 mmol/L   CO2 26 22 - 32 mmol/L   Glucose, Bld 96 65 - 99 mg/dL   BUN 14 6 - 20 mg/dL   Creatinine, Ser 1.11 0.61 - 1.24 mg/dL   Calcium 8.9 8.9 - 10.3 mg/dL   Total Protein 6.5 6.5 - 8.1 g/dL   Albumin 4.1 3.5 - 5.0 g/dL   AST 38 15 - 41 U/L   ALT 40 17 - 63 U/L   Alkaline Phosphatase 70 38 - 126 U/L   Total Bilirubin 0.4 0.3 - 1.2 mg/dL   GFR calc non Af Amer >60 >60 mL/min   GFR calc Af Amer >60 >60 mL/min    Comment: (NOTE) The eGFR has been calculated using the CKD EPI equation. This calculation has not been validated in all clinical situations. eGFR's persistently <60 mL/min signify possible Chronic Kidney Disease.    Anion gap 5 5 - 15  CBC     Status: Abnormal   Collection Time: 07/27/15  4:53 PM  Result Value Ref Range   WBC 9.7 3.8 - 10.6 K/uL   RBC 4.37 (L) 4.40 - 5.90 MIL/uL   Hemoglobin 13.7 13.0 - 18.0 g/dL   HCT 40.1 40.0 - 52.0 %   MCV 91.7 80.0 - 100.0 fL   MCH 31.4 26.0 -  34.0 pg   MCHC 34.2 32.0 - 36.0 g/dL   RDW 13.3 11.5 - 14.5 %   Platelets 193 150 - 440 K/uL  Urinalysis complete, with microscopic ([facility] only)     Status: Abnormal   Collection Time: 07/27/15  7:22 PM  Result Value Ref Range   Color, Urine YELLOW (A) YELLOW   APPearance CLEAR (A) CLEAR   Glucose, UA NEGATIVE NEGATIVE mg/dL   Bilirubin Urine NEGATIVE NEGATIVE   Ketones, ur 1+ (A) NEGATIVE mg/dL  Specific Gravity, Urine 1.024 1.005 - 1.030   Hgb urine dipstick 3+ (A) NEGATIVE   pH 5.0 5.0 - 8.0   Protein, ur NEGATIVE NEGATIVE mg/dL   Nitrite NEGATIVE NEGATIVE   Leukocytes, UA NEGATIVE NEGATIVE   RBC / HPF TOO NUMEROUS TO COUNT 0 - 5 RBC/hpf   WBC, UA 6-30 0 - 5 WBC/hpf   Bacteria, UA NONE SEEN NONE SEEN   Squamous Epithelial / LPF NONE SEEN NONE SEEN   Mucous PRESENT   CBC     Status: Abnormal   Collection Time: 07/28/15  6:01 AM  Result Value Ref Range   WBC 7.9 3.8 - 10.6 K/uL   RBC 4.01 (L) 4.40 - 5.90 MIL/uL   Hemoglobin 12.8 (L) 13.0 - 18.0 g/dL   HCT 36.5 (L) 40.0 - 52.0 %   MCV 90.8 80.0 - 100.0 fL   MCH 31.8 26.0 - 34.0 pg   MCHC 35.0 32.0 - 36.0 g/dL   RDW 13.7 11.5 - 14.5 %   Platelets 167 150 - 440 K/uL  Basic metabolic panel     Status: Abnormal   Collection Time: 07/28/15  6:01 AM  Result Value Ref Range   Sodium 139 135 - 145 mmol/L   Potassium 3.4 (L) 3.5 - 5.1 mmol/L   Chloride 110 101 - 111 mmol/L   CO2 24 22 - 32 mmol/L   Glucose, Bld 98 65 - 99 mg/dL   BUN 14 6 - 20 mg/dL   Creatinine, Ser 0.89 0.61 - 1.24 mg/dL   Calcium 8.0 (L) 8.9 - 10.3 mg/dL   GFR calc non Af Amer >60 >60 mL/min   GFR calc Af Amer >60 >60 mL/min    Comment: (NOTE) The eGFR has been calculated using the CKD EPI equation. This calculation has not been validated in all clinical situations. eGFR's persistently <60 mL/min signify possible Chronic Kidney Disease.    Anion gap 5 5 - 15   No results found for this or any previous visit (from the past 240  hour(s)). Creatinine:  Recent Labs  07/25/15 0355 07/27/15 1653 07/28/15 0601  CREATININE 0.92 1.11 0.89    Impression/Assessment:   Left Proximal Ureteral Stone, uncomplicated -has not been taking adequate doses of analgesia with the episodes of recurrent colic -given the size of the stone, there is a reasonable likelihood of spontaneous passage, which may be increase with medical expulsive therapy with tamsulosin  Discussed with the pt/family, acute options (continue trial of passage with po Dilaudid, rather than Percocet, and tamsulosin vs temproary Ureteral Stent placement) and options for definitve treatment, if the fails trial of passage ([facility] vs Ureteroscopy) -pt has opted for continued trial of passage with IV hydration and trial of po Dilaudid, if the colic recurs, and tamsulosin, with re-evaluation tomorrow -pt/family understand that spontaneous passage may require up to 6 weeks, and may not occur at all  Plan:   1. Continue IV Hydration 2. Trial of po Dilaudid 4-8 mg q4hrs prn with recurrent colic (with parenteral narcotics prn breakthrough) 3. Continue tamsulosin 0.53m po qDay, but change to after dinner 4. Re-evaluate with the Urologist on call tomorrow, if the stone does not pass, to establish the long-term plan  Please do not hesitate to call with questions.  KDelma OfficerH 07/28/2015, 11:32 AM

## 2015-07-29 MED ORDER — KETOROLAC TROMETHAMINE 10 MG PO TABS: 10.0000 mg | ORAL_TABLET | Freq: Four times a day (QID) | ORAL | Status: DC | PRN

## 2015-07-29 MED ORDER — MAGNESIUM CITRATE PO SOLN
1.0000 | Freq: Once | ORAL | Status: AC
  Administered 2015-07-29: 1 via ORAL
  Filled 2015-07-29: qty 296

## 2015-07-29 MED ORDER — PROMETHAZINE HCL 25 MG PO TABS: 25.0000 mg | ORAL_TABLET | Freq: Four times a day (QID) | ORAL | Status: DC | PRN

## 2015-07-29 MED ORDER — POLYETHYLENE GLYCOL 3350 17 G PO PACK
17.0000 g | PACK | Freq: Every day | ORAL | Status: DC
  Administered 2015-07-29: 17 g via ORAL
  Filled 2015-07-29 (×2): qty 1

## 2015-07-29 MED ORDER — HYDROMORPHONE HCL 2 MG PO TABS: 2.0000 mg | ORAL_TABLET | ORAL | Status: DC | PRN

## 2015-07-29 NOTE — Progress Notes (Signed)
Patient ID: Donald Reid, male   DOB: [birth year], 30 y.o.   MRN: [id number]    Subjective: [name] is doing well without flank pain.   He has some mid-abdominal discomfort that is most likely associated with his constipation.   He has no nausea or voiding complaints.   He has no passed his stone but would like to go home. ROS:  Review of Systems  Gastrointestinal: Positive for abdominal pain.  All other systems reviewed and are negative.   Anti-infectives: Anti-infectives    None      Current Facility-Administered Medications  Medication Dose Route Frequency Provider Last Rate Last Dose  . 0.9 %  sodium chloride infusion   Intravenous Continuous Schuyler Amor, MD 125 mL/hr at 07/29/15 0957    . acetaminophen (TYLENOL) tablet 650 mg  650 mg Oral Q6H PRN Lance Coon, MD   650 mg at 07/28/15 1557   Or  . acetaminophen (TYLENOL) suppository 650 mg  650 mg Rectal Q6H PRN Lance Coon, MD      . enoxaparin (LOVENOX) injection 40 mg  40 mg Subcutaneous Q24H Lance Coon, MD   40 mg at 07/27/15 2316  . HYDROmorphone (DILAUDID) tablet 4 mg  4 mg Oral Q4H PRN Srikar Sudini, MD      . ibuprofen (ADVIL,MOTRIN) tablet 400 mg  400 mg Oral Q6H PRN Hillary Bow, MD   400 mg at 07/28/15 1814  . ketorolac (TORADOL) 30 MG/ML injection 30 mg  30 mg Intravenous Once Schuyler Amor, MD   30 mg at 07/27/15 1730  . nicotine (NICODERM CQ - dosed in mg/24 hours) patch 14 mg  14 mg Transdermal Daily Hillary Bow, MD   14 mg at 07/29/15 2992  . ondansetron (ZOFRAN) tablet 4 mg  4 mg Oral Q6H PRN Lance Coon, MD       Or  . ondansetron [facility]) injection 4 mg  4 mg Intravenous Q6H PRN Lance Coon, MD      . polyethylene glycol [facility] / GLYCOLAX) packet 17 g  17 g Oral Daily Lance Coon, MD   17 g at 07/29/15 0032  . senna-docusate (Senokot-S) tablet 1 tablet  1 tablet Oral BID Hillary Bow, MD   1 tablet at 07/28/15 2104  . sodium chloride flush (NS) 0.9 % injection 3 mL  3 mL Intravenous Q12H Lance Coon, MD   3 mL at 07/27/15 2315  . tamsulosin (FLOMAX) capsule 0.4 mg  0.4 mg Oral QPC breakfast Lance Coon, MD   0.4 mg at 07/29/15 0920     Objective: Vital signs in last 24 hours: Temp:  [97.9 F (36.6 C)-98.3 F (36.8 C)] 98.2 F (36.8 C) (07/02 4268) Pulse Rate:  [65-78] 65 (07/02 0638) Resp:  [18-19] 18 (07/02 3419) BP: (117-135)/(75-90) 117/76 mmHg (07/02 0638) SpO2:  [98 %-99 %] 98 % (07/02 6222)  Intake/Output from previous day: 07/01 0701 - 07/02 0700 In: 3130 [P.O.:200; I.V.:2930] Out: 1625 [Urine:1625] Intake/Output this shift: Total I/O In: 100 [P.O.:100] Out: 550 [Urine:550]   Physical Exam  Constitutional: He is well-developed, well-nourished, and in no distress.  Abdominal:  He has no CVAT.   Vitals reviewed.   Lab Results:   Recent Labs  07/27/15 1653 07/28/15 0601  WBC 9.7 7.9  HGB 13.7 12.8*  HCT 40.1 36.5*  PLT 193 167   BMET  Recent Labs  07/27/15 1653 07/28/15 0601  NA 138 139  K 3.8 3.4*  CL 107 110  CO2 26 24  GLUCOSE 96 98  BUN 14 14  CREATININE 1.11 0.89  CALCIUM 8.9 8.0*   PT/INR No results for input(s): LABPROT, INR in the last 72 hours. ABG No results for input(s): PHART, HCO3 in the last 72 hours.  Invalid input(s): PCO2, PO2  Studies/Results: Dg Abd 1 View  07/27/2015  CLINICAL DATA:  Re-evaluate left renal stone. EXAM: ABDOMEN - 1 VIEW COMPARISON:  CT scan July 25, 2015 FINDINGS: The known renal stone has migrated inferiorly now projected over the superior aspect of the L5 left transverse process. Phleboliths are seen in the pelvis. No other acute abnormalities. IMPRESSION: Interval migration of the known left renal stone now projected over the superior aspect of the left L5 transverse process. Electronically Signed   By: Dorise Bullion III M.D   On: 07/27/2015 20:18     Assessment and Plan: 70m left proximal stone, currently without symptoms.  I discussed the options including discharge with continued  MET, ureteroscopy this admission or ESWL when the truck is here.   He would like to go home today and see if he can pass the stone.  I will give him hydromorphone, phenergan and toradol and he will stay on tamsulosin. I will have the office call to make him a f/u appt with Dr. BErlene Quanor Dr. BPilar Jarvisin 1-2 weeks.         LOS: 2 days    WMalka So7/02/2015 [phone]

## 2015-07-29 NOTE — Discharge Summary (Signed)
[facility] [place] [facility]   PATIENT NAME: Donald Reid    MR#:  [id number]  DATE OF BIRTH:  1985-03-09  DATE OF ADMISSION:  07/27/2015 ADMITTING PHYSICIAN: Oralia Manisavid Willis, MD  DATE OF DISCHARGE: 07/29/2015 12:46 PM  PRIMARY CARE PHYSICIAN: No primary care provider on file.   ADMISSION DIAGNOSIS:  Kidney stone [N20.0] Stone (use calc) [N20.0]  DISCHARGE DIAGNOSIS:  Principal Problem:   Renal calculus, left Active Problems:   Intractable pain   Nausea and vomiting   SECONDARY DIAGNOSIS:   Past Medical History  Diagnosis Date  . Cough LAST 2 DAYS    OCCASIONAL GREEN SPUTUM  . Kidney stone 6-7- YRS AGO    DID NOT PASS STONE     ADMITTING HISTORY  Donald Reid is a 30 y.o. male who presents with Persistent pain from kidney stone. Patient has been to the ED 3 times now for the past couple days. He has a known left renal stone 4 x 4 x 5 mm. He has been unable to pass the stone. Hospitalists were called for admission with urology for support.  HOSPITAL COURSE:   Renal calculus, left - admitted with pain and nausea control. Fluids, continue Flomax. Seen by urology. Abd film showed the stone has moved down. Pain has resolved. Pain meds changed and continued on flomax. Dr. Annabell HowellsWrenn with urology has discussed with patient and family in detail. Has f/u appt with urology. Afebrile. Normal WBC No UTI  Stable for discharge home  Constipation- due to narcotics. Started laxatives  CONSULTS OBTAINED:  Treatment Team:  Bjorn PippinJohn Wrenn, MD  DRUG ALLERGIES:  No Known Allergies  DISCHARGE MEDICATIONS:   Discharge Medication List as of 07/29/2015 12:11 PM    START taking these medications   Details  HYDROmorphone (DILAUDID) 2 MG tablet Take 1 tablet (2 mg total) by mouth every 4 (four) hours as needed for moderate pain or severe pain., Starting 07/29/2015, Until Discontinued, Print    ketorolac (TORADOL) 10 MG tablet Take 1 tablet (10 mg total) by  mouth every 6 (six) hours as needed for moderate pain., Starting 07/29/2015, Until Discontinued, Print    promethazine (PHENERGAN) 25 MG tablet Take 1 tablet (25 mg total) by mouth every 6 (six) hours as needed for nausea or vomiting., Starting 07/29/2015, Until Discontinued, Print      CONTINUE these medications which have NOT CHANGED   Details  ibuprofen (ADVIL,MOTRIN) 200 MG tablet Take 200-600 mg by mouth every 6 (six) hours as needed for fever, headache or mild pain., Until Discontinued, Historical Med    ondansetron (ZOFRAN ODT) 4 MG disintegrating tablet Take 1 tablet (4 mg total) by mouth every 8 (eight) hours as needed for nausea or vomiting., Starting 07/25/2015, Until Discontinued, Print    oxyCODONE-acetaminophen (PERCOCET/ROXICET) 5-325 MG tablet Take 1 tablet by mouth every 4 (four) hours as needed for severe pain., Starting 07/25/2015, Until Discontinued, Print    tamsulosin (FLOMAX) 0.4 MG CAPS capsule Take 1 capsule (0.4 mg total) by mouth daily after breakfast., Starting 07/25/2015, Until Tue 07/31/15, Print        Today   VITAL SIGNS:  Blood pressure 117/76, pulse 65, temperature 98.2 F (36.8 C), temperature source Oral, resp. rate 18, height 5\' 7"  (1.702 m), weight 61.1 kg (134 lb 11.2 oz), SpO2 98 %.  I/O:   Intake/Output Summary (Last 24 hours) at 07/29/15 1339 Last data filed at 07/29/15 1227  Gross per 24 hour  Intake 4042.3 ml  Output  1975 ml  Net 2067.3 ml    PHYSICAL EXAMINATION:  Physical Exam  GENERAL:  30 y.o.-year-old patient lying in the bed with no acute distress.  LUNGS: Normal breath sounds bilaterally, no wheezing, rales,rhonchi or crepitation. No use of accessory muscles of respiration.  CARDIOVASCULAR: S1, S2 normal. No murmurs, rubs, or gallops.  ABDOMEN: Soft, non-tender, non-distended. Bowel sounds present. No organomegaly or mass.  NEUROLOGIC: Moves all 4 extremities. PSYCHIATRIC: The patient is alert and oriented x 3.  SKIN: No obvious  rash, lesion, or ulcer.   DATA REVIEW:   CBC  Recent Labs Lab 07/28/15 0601  WBC 7.9  HGB 12.8*  HCT 36.5*  PLT 167    Chemistries   Recent Labs Lab 07/27/15 1653 07/28/15 0601  NA 138 139  K 3.8 3.4*  CL 107 110  CO2 26 24  GLUCOSE 96 98  BUN 14 14  CREATININE 1.11 0.89  CALCIUM 8.9 8.0*  AST 38  --   ALT 40  --   ALKPHOS 70  --   BILITOT 0.4  --     Cardiac Enzymes No results for input(s): TROPONINI in the last 168 hours.  Microbiology Results  Results for orders placed or performed during the hospital encounter of 05/28/11  Surgical pcr screen     Status: None   Collection Time: 05/28/11 10:00 AM  Result Value Ref Range Status   MRSA, PCR NEGATIVE NEGATIVE Final   Staphylococcus aureus NEGATIVE NEGATIVE Final    Comment:        The Xpert SA Assay (FDA approved for NASAL specimens only), is one component of a comprehensive surveillance program.  It is not intended to diagnose infection nor to guide or monitor treatment.    RADIOLOGY:  Dg Abd 1 View  07/27/2015  CLINICAL DATA:  Re-evaluate left renal stone. EXAM: ABDOMEN - 1 VIEW COMPARISON:  CT scan July 25, 2015 FINDINGS: The known renal stone has migrated inferiorly now projected over the superior aspect of the L5 left transverse process. Phleboliths are seen in the pelvis. No other acute abnormalities. IMPRESSION: Interval migration of the known left renal stone now projected over the superior aspect of the left L5 transverse process. Electronically Signed   By: Gerome Samavid  Williams III M.D   On: 07/27/2015 20:18    Follow up with PCP in 1 week.  Management plans discussed with the patient, family and they are in agreement.  CODE STATUS:     Code Status Orders        Start     Ordered   07/27/15 2153  Full code   Continuous     07/27/15 2152    Code Status History    Date Active Date Inactive Code Status Order ID Comments User Context   This patient has a current code status but no  historical code status.      TOTAL TIME TAKING CARE OF THIS PATIENT ON DAY OF DISCHARGE: more than 30 minutes.   Milagros LollSudini, Killian Ress R M.D on 07/29/2015 at 1:39 PM  Between 7am to 6pm - Pager - [phone]  After 6pm go to [email or web address] - password [email or web address] [facility]  [facility]  Office  [phone]  CC: Primary care physician; No primary care provider on file.  Note: This dictation was prepared with Dragon dictation along with smaller phrase technology. Any transcriptional errors that result from this process are unintentional.

## 2015-07-29 NOTE — Progress Notes (Signed)
Pt expresses to RN that he is "feeling a lot of pressure in his abdomen and is scared that it is causing his kidney stone not being able to pass. Also he feels constipated, even though he had a small bowel movement on 07/28/2015". Doctor Sheryle Hailiamond was notified of pt.'s concerns, new orders for 1 bottle of magnesium citrate solution. Will continue to monitor pt.   Donald Reid

## 2015-07-30 ENCOUNTER — Telehealth: Payer: Self-pay | Admitting: Urology

## 2015-07-30 MED ORDER — TAMSULOSIN HCL 0.4 MG PO CAPS: 0.4000 mg | ORAL_CAPSULE | Freq: Every day | ORAL | Status: DC

## 2015-07-30 NOTE — Telephone Encounter (Signed)
Called by nurse from the floor.  Upon discharge yesterday, the patient was not prescribed Flomax inadvertently. He is requesting this medication be sent to his pharmacy which is very reasonable.  Flomax 0.4 mg 30 days [facility] to [facility] [place], [place].  Vanna ScotlandAshley Ripken Rekowski, MD

## 2015-08-07 ENCOUNTER — Ambulatory Visit (INDEPENDENT_AMBULATORY_CARE_PROVIDER_SITE_OTHER): Payer: Self-pay | Admitting: Urology

## 2015-08-07 ENCOUNTER — Encounter: Payer: Self-pay | Admitting: Urology

## 2015-08-07 ENCOUNTER — Ambulatory Visit: Payer: Self-pay | Admitting: Anesthesiology

## 2015-08-07 ENCOUNTER — Encounter
Admission: EM | Disposition: A | Discharge status: Home / Self Care | Payer: Self-pay | Source: Ambulatory Visit | Attending: Urology

## 2015-08-07 ENCOUNTER — Ambulatory Visit
Admission: RE | Admit: 2015-08-07 | Discharge: 2015-08-07 | Disposition: A | Discharge status: Home / Self Care | Payer: MEDICAID | Source: Ambulatory Visit | Attending: Urology | Admitting: Urology

## 2015-08-07 ENCOUNTER — Encounter: Payer: Self-pay | Admitting: *Deleted

## 2015-08-07 ENCOUNTER — Observation Stay
Admission: EM | Admit: 2015-08-07 | Discharge: 2015-08-08 | Disposition: A | Discharge status: Home / Self Care | Payer: Self-pay | Source: Ambulatory Visit | Attending: Urology | Admitting: Urology

## 2015-08-07 VITALS — BP 112/68 | HR 90 | Temp 97.6°F | Ht 67.0 in | Wt 129.8 lb

## 2015-08-07 DIAGNOSIS — Z79899 Other long term (current) drug therapy: Secondary | ICD-10-CM | POA: Insufficient documentation

## 2015-08-07 DIAGNOSIS — N201 Calculus of ureter: Secondary | ICD-10-CM

## 2015-08-07 DIAGNOSIS — N200 Calculus of kidney: Secondary | ICD-10-CM

## 2015-08-07 DIAGNOSIS — N202 Calculus of kidney with calculus of ureter: Secondary | ICD-10-CM | POA: Insufficient documentation

## 2015-08-07 DIAGNOSIS — Z79891 Long term (current) use of opiate analgesic: Secondary | ICD-10-CM | POA: Insufficient documentation

## 2015-08-07 DIAGNOSIS — Z87891 Personal history of nicotine dependence: Secondary | ICD-10-CM | POA: Insufficient documentation

## 2015-08-07 DIAGNOSIS — N136 Pyonephrosis: Principal | ICD-10-CM | POA: Insufficient documentation

## 2015-08-07 HISTORY — PX: CYSTOSCOPY WITH STENT PLACEMENT: SHX5790

## 2015-08-07 HISTORY — PX: CYSTOSCOPY W/ RETROGRADES: SHX1426

## 2015-08-07 LAB — URINALYSIS, COMPLETE
BILIRUBIN UA: NEGATIVE
Glucose, UA: NEGATIVE
Nitrite, UA: POSITIVE — AB
SPEC GRAV UA: 1.025 (ref 1.005–1.030)
Urobilinogen, Ur: 2 mg/dL — ABNORMAL HIGH (ref 0.2–1.0)
pH, UA: 6 (ref 5.0–7.5)

## 2015-08-07 LAB — MICROSCOPIC EXAMINATION
RBC, UA: NONE SEEN /hpf (ref 0–?)
WBC, UA: >30 /hpf — ABNORMAL HIGH (ref 0–?)

## 2015-08-07 SURGERY — CYSTOSCOPY, WITH RETROGRADE PYELOGRAM
Anesthesia: General | Laterality: Left | Wound class: Dirty or Infected

## 2015-08-07 MED ORDER — CEFTRIAXONE SODIUM 1 G IJ SOLR
1.0000 g | Freq: Once | INTRAMUSCULAR | Status: AC
  Administered 2015-08-07: 1 g via INTRAMUSCULAR

## 2015-08-07 MED ORDER — LIDOCAINE HCL 2 % EX GEL
CUTANEOUS | Status: DC | PRN
  Administered 2015-08-07: 1 via URETHRAL

## 2015-08-07 MED ORDER — CEFTRIAXONE SODIUM 2 G IJ SOLR
2.0000 g | Freq: Once | INTRAMUSCULAR | Status: AC
Start: 2015-08-07 — End: 2015-08-07
  Administered 2015-08-07: 2 g via INTRAVENOUS
  Filled 2015-08-07: qty 2

## 2015-08-07 MED ORDER — ACETAMINOPHEN 10 MG/ML IV SOLN
1000.0000 mg | Freq: Four times a day (QID) | INTRAVENOUS | Status: DC
  Administered 2015-08-07 – 2015-08-08 (×3): 1000 mg via INTRAVENOUS
  Filled 2015-08-07 (×7): qty 100

## 2015-08-07 MED ORDER — SODIUM CHLORIDE 0.9 % IV SOLN
INTRAVENOUS | Status: DC
  Administered 2015-08-07 – 2015-08-08 (×2): via INTRAVENOUS

## 2015-08-07 MED ORDER — LIDOCAINE HCL 2 % EX GEL
CUTANEOUS | Status: AC
  Filled 2015-08-07: qty 10

## 2015-08-07 MED ORDER — ONDANSETRON HCL 4 MG/2ML IJ SOLN
INTRAMUSCULAR | Status: AC
  Filled 2015-08-07: qty 2

## 2015-08-07 MED ORDER — HYDROMORPHONE HCL 1 MG/ML IJ SOLN: 0.5000 mg | INTRAMUSCULAR | Status: DC | PRN

## 2015-08-07 MED ORDER — ACETAMINOPHEN 10 MG/ML IV SOLN
INTRAVENOUS | Status: AC
  Filled 2015-08-07: qty 100

## 2015-08-07 MED ORDER — FENTANYL CITRATE (PF) 100 MCG/2ML IJ SOLN: 25.0000 ug | INTRAMUSCULAR | Status: DC | PRN

## 2015-08-07 MED ORDER — KETOROLAC TROMETHAMINE 15 MG/ML IJ SOLN
15.0000 mg | Freq: Four times a day (QID) | INTRAMUSCULAR | Status: DC | PRN
  Filled 2015-08-07: qty 1

## 2015-08-07 MED ORDER — CIPROFLOXACIN HCL 250 MG PO TABS
500.0000 mg | ORAL_TABLET | Freq: Two times a day (BID) | ORAL | Status: DC
  Administered 2015-08-07 – 2015-08-08 (×2): 500 mg via ORAL
  Filled 2015-08-07 (×2): qty 2

## 2015-08-07 MED ORDER — SUCCINYLCHOLINE CHLORIDE 20 MG/ML IJ SOLN
INTRAMUSCULAR | Status: DC | PRN
  Administered 2015-08-07: 100 mg via INTRAVENOUS

## 2015-08-07 MED ORDER — ONDANSETRON HCL 4 MG/2ML IJ SOLN
4.0000 mg | Freq: Once | INTRAMUSCULAR | Status: AC
  Administered 2015-08-07: 4 mg via INTRAVENOUS

## 2015-08-07 MED ORDER — KETOROLAC TROMETHAMINE 60 MG/2ML IM SOLN
60.0000 mg | Freq: Once | INTRAMUSCULAR | Status: AC
  Administered 2015-08-07: 60 mg via INTRAMUSCULAR

## 2015-08-07 MED ORDER — ONDANSETRON HCL 4 MG/2ML IJ SOLN: 4.0000 mg | Freq: Four times a day (QID) | INTRAMUSCULAR | Status: DC | PRN

## 2015-08-07 MED ORDER — PROPOFOL 10 MG/ML IV BOLUS
INTRAVENOUS | Status: DC | PRN
  Administered 2015-08-07: 200 mg via INTRAVENOUS

## 2015-08-07 MED ORDER — SODIUM CHLORIDE 0.9 % IV SOLN
INTRAVENOUS | Status: DC
  Administered 2015-08-07: 15:00:00 via INTRAVENOUS

## 2015-08-07 MED ORDER — DOCUSATE SODIUM 100 MG PO CAPS
100.0000 mg | ORAL_CAPSULE | Freq: Two times a day (BID) | ORAL | Status: DC
  Filled 2015-08-07: qty 1

## 2015-08-07 MED ORDER — OXYCODONE-ACETAMINOPHEN 5-325 MG PO TABS: 1.0000 | ORAL_TABLET | ORAL | Status: DC | PRN

## 2015-08-07 MED ORDER — LIDOCAINE HCL (CARDIAC) 20 MG/ML IV SOLN
INTRAVENOUS | Status: DC | PRN
  Administered 2015-08-07: 50 mg via INTRAVENOUS

## 2015-08-07 MED ORDER — ACETAMINOPHEN 10 MG/ML IV SOLN
INTRAVENOUS | Status: DC | PRN
  Administered 2015-08-07: 1000 mg via INTRAVENOUS

## 2015-08-07 MED ORDER — FENTANYL CITRATE (PF) 100 MCG/2ML IJ SOLN
INTRAMUSCULAR | Status: DC | PRN
  Administered 2015-08-07 (×2): 50 ug via INTRAVENOUS

## 2015-08-07 MED ORDER — MIDAZOLAM HCL 2 MG/2ML IJ SOLN
INTRAMUSCULAR | Status: DC | PRN
  Administered 2015-08-07: 2 mg via INTRAVENOUS

## 2015-08-07 MED ORDER — IOTHALAMATE MEGLUMINE 43 % IV SOLN
INTRAVENOUS | Status: DC | PRN
  Administered 2015-08-07: 5 mL via URETHRAL

## 2015-08-07 MED ORDER — ONDANSETRON HCL 4 MG/2ML IJ SOLN: 4.0000 mg | Freq: Once | INTRAMUSCULAR | Status: DC | PRN

## 2015-08-07 MED ORDER — TAMSULOSIN HCL 0.4 MG PO CAPS
0.4000 mg | ORAL_CAPSULE | Freq: Every day | ORAL | Status: DC
  Administered 2015-08-08: 0.4 mg via ORAL
  Filled 2015-08-07: qty 1

## 2015-08-07 SURGICAL SUPPLY — 34 items
BAG DRAIN CYSTO-URO LG1000N (MISCELLANEOUS) ×2 IMPLANT
CATH FOL LX CONE TIP  8F (CATHETERS)
CATH FOL LX CONE TIP 8F (CATHETERS) IMPLANT
CATH URETL 5X70 OPEN END (CATHETERS) ×2 IMPLANT
CATH URETL OPEN END 6X70 (CATHETERS) IMPLANT
CONRAY 43 FOR UROLOGY 50M (MISCELLANEOUS) ×2 IMPLANT
FEE TECHNICIAN ONLY PER HOUR (MISCELLANEOUS) IMPLANT
GLOVE BIO SURGEON STRL SZ7 (GLOVE) ×4 IMPLANT
GLOVE BIO SURGEON STRL SZ7.5 (GLOVE) ×2 IMPLANT
GOWN STRL REUS W/ TWL LRG LVL3 (GOWN DISPOSABLE) ×2 IMPLANT
GOWN STRL REUS W/ TWL LRG LVL4 (GOWN DISPOSABLE) ×1 IMPLANT
GOWN STRL REUS W/TWL LRG LVL3 (GOWN DISPOSABLE) ×2
GOWN STRL REUS W/TWL LRG LVL4 (GOWN DISPOSABLE) ×1
GOWN STRL REUS W/TWL XL LVL3 (GOWN DISPOSABLE) ×2 IMPLANT
GUIDEWIRE ANG ZIPWIRE 035X150 (WIRE) IMPLANT
INTRODUCER DILATOR DOUBLE (INTRODUCER) IMPLANT
KIT RM TURNOVER CYSTO AR (KITS) ×2 IMPLANT
LASER HOLMIUM FIBER SU 272UM (MISCELLANEOUS) IMPLANT
PACK CYSTO AR (MISCELLANEOUS) ×2 IMPLANT
PREP PVP WINGED SPONGE (MISCELLANEOUS) ×2 IMPLANT
SENSORWIRE 0.038 NOT ANGLED (WIRE) ×2
SET CYSTO W/LG BORE CLAMP LF (SET/KITS/TRAYS/PACK) ×2 IMPLANT
SOL .9 NS 3000ML IRR  AL (IV SOLUTION) ×1
SOL .9 NS 3000ML IRR UROMATIC (IV SOLUTION) ×1 IMPLANT
SOL PREP PVP 2OZ (MISCELLANEOUS) ×2
SOLUTION PREP PVP 2OZ (MISCELLANEOUS) ×1 IMPLANT
STENT PERCUFLEX 4.8FRX24 (STENTS) ×2 IMPLANT
STENT URET 6FRX24 CONTOUR (STENTS) IMPLANT
STENT URET 6FRX26 CONTOUR (STENTS) IMPLANT
SURGILUBE 2OZ TUBE FLIPTOP (MISCELLANEOUS) ×2 IMPLANT
SYRINGE IRR TOOMEY STRL 70CC (SYRINGE) ×2 IMPLANT
TUBING CONNECTING 10 (TUBING) ×2 IMPLANT
WATER STERILE IRR 1000ML POUR (IV SOLUTION) ×2 IMPLANT
WIRE SENSOR 0.038 NOT ANGLED (WIRE) ×1 IMPLANT

## 2015-08-07 NOTE — Op Note (Signed)
Preoperative diagnosis:  1. Left obstructing distal ureteral stone   Postoperative diagnosis:  1. same   Procedure:  1. Cystoscopy 2. left ureteral stent placement 3. left retrograde pyelography with interpretation   Surgeon: Crist FatBenjamin W. Herrick, MD  Anesthesia: General  Complications: None  Intraoperative findings:  left retrograde pyelography demonstrated a filling defect within the left distal ureter consistent with the patient's known calculus with proximal hydronephrosis without other abnormalities.  The stone was impacted and difficult to pass.  42F stent was not passable - 4.24F stent was left instead - 26cm x 4.24F JJ.  EBL: Minimal  Specimens: None  Indication: Donald Reid is a 30 y.o. patient with left distal ureteral stone and fever. After reviewing the management options for treatment, he elected to proceed with the above surgical procedure(s). We have discussed the potential benefits and risks of the procedure, side effects of the proposed treatment, the likelihood of the patient achieving the goals of the procedure, and any potential problems that might occur during the procedure or recuperation. Informed consent has been obtained.  Description of procedure:  The patient was taken to the operating room and general anesthesia was induced.  The patient was placed in the dorsal lithotomy position, prepped and draped in the usual sterile fashion, and preoperative antibiotics were administered. A preoperative time-out was performed.   Cystourethroscopy was performed.  The patient's urethra was examined and was normal. The bladder was then systematically examined in its entirety. There was no evidence for any bladder tumors, stones, or other mucosal pathology.    Attention then turned to the leftureteral orifice and a ureteral catheter was used to intubate the ureteral orifice.  Omnipaque contrast was injected through the ureteral catheter and a retrograde pyelogram was  performed with findings as dictated above.  A 0.38 sensor guidewire was then advanced up the left ureter into the renal pelvis under fluoroscopic guidance.  The wire was then backloaded through the cystoscope and a ureteral stent was advance over the wire using Seldinger technique.  The 42F stent was unable to pass the stone in the distal ureter.  The 4.24F stent was placed with some resistance.  The stent was positioned appropriately under fluoroscopic and cystoscopic guidance.  The wire was then removed with an adequate stent curl noted in the renal pelvis as well as in the bladder.  The bladder was then emptied and the procedure ended.  The patient appeared to tolerate the procedure well and without complications.  The patient was able to be awakened and transferred to the recovery unit in satisfactory condition.    Crist FatBenjamin W. Herrick, M.D.

## 2015-08-07 NOTE — Progress Notes (Signed)
Per verbal order from Dr. Ronne BinningMckenzie, give patient Rochephin 1 g and Toradol 60 mg.

## 2015-08-07 NOTE — Transfer of Care (Signed)
Immediate Anesthesia Transfer of Care Note  Patient: Donald Reid  Procedure(s) Performed: Procedure(s): CYSTOSCOPY WITH RETROGRADE PYELOGRAM (Left) CYSTOSCOPY WITH STENT PLACEMENT (Left)  Patient Location: PACU  Anesthesia Type:General  Level of Consciousness: awake, alert , oriented and patient cooperative  Airway & Oxygen Therapy: Patient Spontanous Breathing  Post-op Assessment: Report given to RN and Post -op Vital signs reviewed and stable  Post vital signs: Reviewed and stable  Last Vitals:  Filed Vitals:   08/07/15 1445 08/07/15 1853  BP: 104/79 113/73  Pulse: 78 103  Temp: 36.3 C 38.1 C  Resp: 18 18    Last Pain: There were no vitals filed for this visit.       Complications: No apparent anesthesia complications

## 2015-08-07 NOTE — Progress Notes (Signed)
08/07/2015 5:52 PM   Donald Reid 15-Jun-1985 [id number]  Referring provider: No referring provider defined for this encounter.  No chief complaint on file.   HPI: Mr Donald Reid is a 29yo seen today for a left ureteral calculus. He presented to the ER on 6/28 with severe left flank pain. CT scan showed a 4mm left proximal ureteral calculi and no other renal calculi. This is his second stone event. His last stone was 11-12 years ago.  Over the last 2 days the pain is now constant, intermittent, left flank pain that radiates to the left testicle. He has been having chills and sweats for the past 2 days. UA today is concerning for infection.  He has new  Mild dysuria, urgency and frequency. He has been drinking water and gatorade.  KUB today does not show the stone definitively.   PMH: Past Medical History  Diagnosis Date  . Cough LAST 2 DAYS    OCCASIONAL GREEN SPUTUM  . Kidney stone 6-7- YRS AGO    DID NOT PASS STONE  . Anxiety     Surgical History: Past Surgical History  Procedure Laterality Date  . Inguinal hernia repair  05/30/2011    Procedure: HERNIA REPAIR INGUINAL ADULT;  Surgeon: Emelia LoronMatthew Wakefield, MD;  Location: [facility];  Service: General;  Laterality: Right;    Home Medications:    Medication List    ASK your doctor about these medications        HYDROmorphone 2 MG tablet  Commonly known as:  DILAUDID  Take 1 tablet (2 mg total) by mouth every 4 (four) hours as needed for moderate pain or severe pain.     ibuprofen 200 MG tablet  Commonly known as:  ADVIL,MOTRIN  Take 200-600 mg by mouth every 6 (six) hours as needed for fever, headache or mild pain.     ketorolac 10 MG tablet  Commonly known as:  TORADOL  Take 1 tablet (10 mg total) by mouth every 6 (six) hours as needed for moderate pain.     ondansetron 4 MG disintegrating tablet  Commonly known as:  ZOFRAN ODT  Take 1 tablet (4 mg total) by mouth every 8 (eight) hours as needed for nausea or vomiting.       oxyCODONE-acetaminophen 5-325 MG tablet  Commonly known as:  PERCOCET/ROXICET  Take 1 tablet by mouth every 4 (four) hours as needed for severe pain.     promethazine 25 MG tablet  Commonly known as:  PHENERGAN  Take 1 tablet (25 mg total) by mouth every 6 (six) hours as needed for nausea or vomiting.     tamsulosin 0.4 MG Caps capsule  Commonly known as:  FLOMAX  Take 0.4 mg by mouth daily.        Allergies: No Known Allergies  Family History: Family History  Problem Relation Age of Onset  . Kidney disease Neg Hx   . Prostate cancer Neg Hx     Social History:  reports that he has quit smoking. His smoking use included Cigarettes. He has a 11 pack-year smoking history. He has never used smokeless tobacco. He reports that he uses illicit drugs (Marijuana). He reports that he does not drink alcohol.  ROS:                                        Physical Exam: BP 104/79 mmHg  Pulse 78  Temp(Src) 97.3 F (36.3 C) (Tympanic)  Resp 18  Ht  (1.702 m)  Wt 58.06 kg (128 lb)  BMI 20.04 kg/m2  SpO2 100%  Constitutional:  Alert and oriented, No acute distress. HEENT: [place] AT, moist mucus membranes.  Trachea midline, no masses. Cardiovascular: No clubbing, cyanosis, or edema. RRR Respiratory: Normal respiratory effort, no increased work of breathing. CTAB GI: Abdomen is soft, nontender, nondistended, no abdominal masses GU: No CVA tenderness. Circumcised phallus, no masses/lesions on penis, testes, scrotum.  Skin: No rashes, bruises or suspicious lesions. Lymph: No cervical or inguinal adenopathy. Neurologic: Grossly intact, no focal deficits, moving all 4 extremities. Psychiatric: Normal mood and affect.  Laboratory Data: Lab Results  Component Value Date   WBC 7.9 07/28/2015   HGB 12.8* 07/28/2015   HCT 36.5* 07/28/2015   MCV 90.8 07/28/2015   PLT 167 07/28/2015    Lab Results  Component Value Date   CREATININE 0.89 07/28/2015     No results found for: PSA  No results found for: TESTOSTERONE  No results found for: HGBA1C  Urinalysis    Component Value Date/Time   COLORURINE YELLOW* 07/27/2015 1922   APPEARANCEUR Cloudy* 08/07/2015 0922   APPEARANCEUR CLEAR* 07/27/2015 1922   LABSPEC 1.024 07/27/2015 1922   PHURINE 5.0 07/27/2015 1922   GLUCOSEU Negative 08/07/2015 0922   HGBUR 3+* 07/27/2015 1922   BILIRUBINUR Negative 08/07/2015 0922   BILIRUBINUR NEGATIVE 07/27/2015 1922   KETONESUR 1+* 07/27/2015 1922   PROTEINUR 3+* 08/07/2015 0922   PROTEINUR NEGATIVE 07/27/2015 1922   NITRITE Positive* 08/07/2015 0922   NITRITE NEGATIVE 07/27/2015 1922   LEUKOCYTESUR 1+* 08/07/2015 0922   LEUKOCYTESUR NEGATIVE 07/27/2015 1922    Pertinent Imaging: CT stone, KUB  Assessment & Plan:    1. Renal calculus, left -Urine for culture, IM rocephin 1g given -Will admit to hospital and schedule for left ureteral stent placement today -risks/benefits/alternatives to cystoscopy, left retrograde, left ureteral stent placement was explained to the patient and he understands and wishes to proceed with surgery - Abdomen 1 view (KUB); Future - Urinalysis, Complete   No Follow-up on file.  Crist Fat, MD  [facility] [address] [place], [place] [address] [phone]

## 2015-08-07 NOTE — Anesthesia Preprocedure Evaluation (Addendum)
Anesthesia Evaluation  Patient identified by MRN, date of birth, ID band Patient awake    Reviewed: Allergy & Precautions, NPO status , Patient's Chart, lab work & pertinent test results, reviewed documented beta blocker date and time   Airway Mallampati: II  TM Distance: >3 FB     Dental  (+) Chipped   Pulmonary former smoker,           Cardiovascular      Neuro/Psych Anxiety    GI/Hepatic   Endo/Other    Renal/GU Renal InsufficiencyRenal disease     Musculoskeletal   Abdominal   Peds  Hematology   Anesthesia Other Findings Denies cough. No fever. Chest clear. Will proceed.  Reproductive/Obstetrics                            Anesthesia Physical Anesthesia Plan  ASA: II  Anesthesia Plan: General   Post-op Pain Management:    Induction: Intravenous  Airway Management Planned: LMA  Additional Equipment:   Intra-op Plan:   Post-operative Plan:   Informed Consent: I have reviewed the patients History and Physical, chart, labs and discussed the procedure including the risks, benefits and alternatives for the proposed anesthesia with the patient or authorized representative who has indicated his/her understanding and acceptance.     Plan Discussed with: CRNA  Anesthesia Plan Comments:         Anesthesia Quick Evaluation

## 2015-08-07 NOTE — Progress Notes (Signed)
Given zofran for c/o nausea and dry heaves per Dr. Maisie Fushomas orders. Patient had gotten relief within 10 minutes.

## 2015-08-07 NOTE — Progress Notes (Signed)
08/07/2015 9:39 AM   Donald Reid [birth year] [id number]  Referring provider: No referring provider defined for this encounter.  Chief Complaint  Patient presents with  . Nephrolithiasis    Ureteral stone KUB done this am    HPI: Mr Donald Reid is a 29yo seen today for a left ureteral calculus. He presented to the ER on 6/28 with severe left flank pain. CT scan showed a 4mm left proximal ureteral calculi and no other renal calculi. This is his second stone event. His last stone was 11-12 years ago.  Over the last 2 days the pain is now constant, intermittent, left flank pain that radiates to the left testicle. He has been having chills and sweats for the past 2 days. UA today is concerning for infection.  He has new  Mild dysuria, urgency and frequency. He has been drinking water and gatorade.  KUB today does not show.     PMH: Past Medical History  Diagnosis Date  . Cough LAST 2 DAYS    OCCASIONAL GREEN SPUTUM  . Kidney stone 6-7- YRS AGO    DID NOT PASS STONE  . Anxiety     Surgical History: Past Surgical History  Procedure Laterality Date  . Inguinal hernia repair  05/30/2011    Procedure: HERNIA REPAIR INGUINAL ADULT;  Surgeon: Emelia Loron, MD;  Location: [facility];  Service: General;  Laterality: Right;    Home Medications:    Medication List       This list is accurate as of: 08/07/15  9:39 AM.  Always use your most recent med list.               HYDROmorphone 2 MG tablet  Commonly known as:  DILAUDID  Take 1 tablet (2 mg total) by mouth every 4 (four) hours as needed for moderate pain or severe pain.     ibuprofen 200 MG tablet  Commonly known as:  ADVIL,MOTRIN  Take 200-600 mg by mouth every 6 (six) hours as needed for fever, headache or mild pain.     ketorolac 10 MG tablet  Commonly known as:  TORADOL  Take 1 tablet (10 mg total) by mouth every 6 (six) hours as needed for moderate pain.     ondansetron 4 MG disintegrating tablet  Commonly known  as:  ZOFRAN ODT  Take 1 tablet (4 mg total) by mouth every 8 (eight) hours as needed for nausea or vomiting.     oxyCODONE-acetaminophen 5-325 MG tablet  Commonly known as:  PERCOCET/ROXICET  Take 1 tablet by mouth every 4 (four) hours as needed for severe pain.     promethazine 25 MG tablet  Commonly known as:  PHENERGAN  Take 1 tablet (25 mg total) by mouth every 6 (six) hours as needed for nausea or vomiting.     tamsulosin 0.4 MG Caps capsule  Commonly known as:  FLOMAX  Take 0.4 mg by mouth daily.        Allergies: No Known Allergies  Family History: Family History  Problem Relation Age of Onset  . Kidney disease Neg Hx   . Prostate cancer Neg Hx     Social History:  reports that he has quit smoking. His smoking use included Cigarettes. He has a 11 pack-year smoking history. He has never used smokeless tobacco. He reports that he uses illicit drugs (Marijuana). He reports that he does not drink alcohol.  ROS: UROLOGY Frequent Urination?: Yes Hard to postpone urination?: Yes Burning/pain with urination?: No  Get up at night to urinate?: Yes Leakage of urine?: No Urine stream starts and stops?: No Trouble starting stream?: No Do you have to strain to urinate?: No Blood in urine?: No Urinary tract infection?: No Sexually transmitted disease?: No Injury to kidneys or bladder?: No Painful intercourse?: No Weak stream?: No Erection problems?: No Penile pain?: No  Gastrointestinal Nausea?: Yes Vomiting?: Yes Indigestion/heartburn?: No Diarrhea?: Yes Constipation?: Yes  Constitutional Fever: Yes Night sweats?: Yes Weight loss?: Yes Fatigue?: Yes  Skin Skin rash/lesions?: No Itching?: No  Eyes Blurred vision?: Yes Double vision?: No  Ears/Nose/Throat Sore throat?: No Sinus problems?: No  Hematologic/Lymphatic Swollen glands?: No Easy bruising?: No  Cardiovascular Leg swelling?: No Chest pain?: No  Respiratory Cough?: No Shortness of  breath?: No  Endocrine Excessive thirst?: Yes  Musculoskeletal Back pain?: Yes Joint pain?: Yes  Neurological Headaches?: Yes Dizziness?: Yes  Psychologic Depression?: No Anxiety?: Yes  Physical Exam: BP 112/68 mmHg  Pulse 90  Temp(Src) 97.6 F (36.4 C) (Oral)  Ht 5\' 7"  (1.702 m)  Wt 58.877 kg (129 lb 12.8 oz)  BMI 20.32 kg/m2  Constitutional:  Alert and oriented, No acute distress. HEENT: Donald Reid AT, moist mucus membranes.  Trachea midline, no masses. Cardiovascular: No clubbing, cyanosis, or edema. RRR Respiratory: Normal respiratory effort, no increased work of breathing. CTAB GI: Abdomen is soft, nontender, nondistended, no abdominal masses GU: No CVA tenderness. Circumcised phallus, no masses/lesions on penis, testes, scrotum.  Skin: No rashes, bruises or suspicious lesions. Lymph: No cervical or inguinal adenopathy. Neurologic: Grossly intact, no focal deficits, moving all 4 extremities. Psychiatric: Normal mood and affect.  Laboratory Data: Lab Results  Component Value Date   WBC 7.9 07/28/2015   HGB 12.8* 07/28/2015   HCT 36.5* 07/28/2015   MCV 90.8 07/28/2015   PLT 167 07/28/2015    Lab Results  Component Value Date   CREATININE 0.89 07/28/2015    No results found for: PSA  No results found for: TESTOSTERONE  No results found for: HGBA1C  Urinalysis    Component Value Date/Time   COLORURINE YELLOW* 07/27/2015 1922   APPEARANCEUR CLEAR* 07/27/2015 1922   LABSPEC 1.024 07/27/2015 1922   PHURINE 5.0 07/27/2015 1922   GLUCOSEU NEGATIVE 07/27/2015 1922   HGBUR 3+* 07/27/2015 1922   BILIRUBINUR NEGATIVE 07/27/2015 1922   KETONESUR 1+* 07/27/2015 1922   PROTEINUR NEGATIVE 07/27/2015 1922   NITRITE NEGATIVE 07/27/2015 1922   LEUKOCYTESUR NEGATIVE 07/27/2015 1922    Pertinent Imaging: CT stone, KUB  Assessment & Plan:    1. Renal calculus, left -Urine for culture, IM rocephin 1g given -Will admit to hospital and schedule for left ureteral  stent placement today -risks/benefits/alternatives to cystoscopy, left retrograde, left ureteral stent placement was explained to the patient and he understands and wishes to proceed with surgery - Abdomen 1 view (KUB); Future - Urinalysis, Complete   No Follow-up on file.  Wilkie AyePatrick Brexlee Heberlein, MD  [facility] [address], [address] [place], [place] [id number] [phone]

## 2015-08-07 NOTE — Anesthesia Procedure Notes (Signed)
Procedure Name: Intubation Date/Time: 08/07/2015 6:13 PM Performed by: Waldo LaineJUSTIS, Fanny Agan Pre-anesthesia Checklist: Patient identified, Emergency Drugs available, Suction available, Patient being monitored and Timeout performed Patient Re-evaluated:Patient Re-evaluated prior to inductionOxygen Delivery Method: Circle system utilized Preoxygenation: Pre-oxygenation with 100% oxygen Intubation Type: IV induction, Cricoid Pressure applied and Rapid sequence Laryngoscope Size: Miller and 2 Grade View: Grade I Number of attempts: 1 Airway Equipment and Method: Stylet Placement Confirmation: ETT inserted through vocal cords under direct vision,  positive ETCO2,  CO2 detector and breath sounds checked- equal and bilateral Secured at: 21 cm Tube secured with: Tape Dental Injury: Teeth and Oropharynx as per pre-operative assessment

## 2015-08-07 NOTE — Anesthesia Postprocedure Evaluation (Signed)
Anesthesia Post Note  Patient: Donald Reid  Procedure(s) Performed: Procedure(s) (LRB): CYSTOSCOPY WITH RETROGRADE PYELOGRAM (Left) CYSTOSCOPY WITH STENT PLACEMENT (Left)  Patient location during evaluation: PACU Anesthesia Type: General Level of consciousness: awake and alert Pain management: pain level controlled Vital Signs Assessment: post-procedure vital signs reviewed and stable Respiratory status: spontaneous breathing, nonlabored ventilation, respiratory function stable and patient connected to nasal cannula oxygen Cardiovascular status: blood pressure returned to baseline and stable Postop Assessment: no signs of nausea or vomiting Anesthetic complications: no    Last Vitals:  Filed Vitals:   08/07/15 1923 08/07/15 1933  BP: 115/74 116/76  Pulse: 88 87  Temp:  37.8 C  Resp: 24 13    Last Pain: There were no vitals filed for this visit.               Romeo Zielinski S

## 2015-08-08 ENCOUNTER — Encounter: Payer: Self-pay | Admitting: Urology

## 2015-08-08 DIAGNOSIS — N201 Calculus of ureter: Secondary | ICD-10-CM

## 2015-08-08 MED ORDER — OXYCODONE-ACETAMINOPHEN 5-325 MG PO TABS: 1.0000 | ORAL_TABLET | ORAL | Status: DC | PRN

## 2015-08-08 MED ORDER — DOCUSATE SODIUM 100 MG PO CAPS: 100.0000 mg | ORAL_CAPSULE | Freq: Two times a day (BID) | ORAL | Status: DC

## 2015-08-08 MED ORDER — OXYBUTYNIN CHLORIDE 5 MG PO TABS: 5.0000 mg | ORAL_TABLET | Freq: Three times a day (TID) | ORAL | Status: DC | PRN

## 2015-08-08 MED ORDER — CIPROFLOXACIN HCL 500 MG PO TABS: 500.0000 mg | ORAL_TABLET | Freq: Two times a day (BID) | ORAL | Status: DC

## 2015-08-08 NOTE — Discharge Summary (Signed)
Date of admission: 08/07/2015  Date of discharge: 08/08/2015  Admission diagnosis: Left ureteral stone, UTI   Discharge diagnosis: Left ureteral stone, UTI  Secondary diagnoses:  Patient Active Problem List   Diagnosis Date Noted  . Nephrolithiasis 08/07/2015  . Renal calculus, left 07/27/2015  . Intractable pain 07/27/2015  . Nausea and vomiting 07/27/2015    History and Physical: For full details, please see admission history and physical. Briefly, Donald Reid is a 30 y.o. year old patient with 4 mm left ureteral stone, UTI admitted following left ureteral stent placement.  Hospital Course: Patient tolerated the procedure well.  He was then transferred to the floor after an uneventful PACU stay.  His hospital course was uncomplicated.  On POD#1 he had met discharge criteria: was eating a regular diet, was up and ambulating independently,  pain was well controlled, was voiding without a catheter, and was ready to for discharge.   Laboratory values:  No results for input(s): WBC, HGB, HCT in the last 72 hours. No results for input(s): NA, K, CL, CO2, GLUCOSE, BUN, CREATININE, CALCIUM in the last 72 hours. No results for input(s): LABPT, INR in the last 72 hours. No results for input(s): LABURIN in the last 72 hours. Results for orders placed or performed in visit on 08/07/15  Microscopic Examination     Status: Abnormal   Collection Time: 08/07/15  9:22 AM  Result Value Ref Range Status   WBC, UA >30 (H) 0 -  5 /hpf Final   RBC, UA None seen 0 -  2 /hpf Final   Epithelial Cells (non renal) 0-10 0 - 10 /hpf Final   Bacteria, UA Many (A) None seen/Few Final   Yeast, UA Present (A) None seen Final    Disposition: Home  Discharge instruction: The patient was instructed to be ambulatory but told to refrain from heavy lifting, strenuous activity, or driving.   You have a ureteral stent in place.  This is a tube that extends from your kidney to your bladder.  This may cause urinary  bleeding, burning with urination, and urinary frequency.  Please call our office or present to the ED if you develop fevers >101 or pain which is not able to be controlled with oral pain medications.  You may be given either Flomax and/ or ditropan to help with bladder spasms and stent pain in addition to pain medications.     Discharge medications:    Medication List    TAKE these medications        ciprofloxacin 500 MG tablet  Commonly known as:  CIPRO  Take 1 tablet (500 mg total) by mouth 2 (two) times daily.     docusate sodium 100 MG capsule  Commonly known as:  COLACE  Take 1 capsule (100 mg total) by mouth 2 (two) times daily.     HYDROmorphone 2 MG tablet  Commonly known as:  DILAUDID  Take 1 tablet (2 mg total) by mouth every 4 (four) hours as needed for moderate pain or severe pain.     ibuprofen 200 MG tablet  Commonly known as:  ADVIL,MOTRIN  Take 200-600 mg by mouth every 6 (six) hours as needed for fever, headache or mild pain.     ketorolac 10 MG tablet  Commonly known as:  TORADOL  Take 1 tablet (10 mg total) by mouth every 6 (six) hours as needed for moderate pain.     ondansetron 4 MG disintegrating tablet  Commonly known as:  ZOFRAN ODT  Take 1 tablet (4 mg total) by mouth every 8 (eight) hours as needed for nausea or vomiting.     oxybutynin 5 MG tablet  Commonly known as:  DITROPAN  Take 1 tablet (5 mg total) by mouth every 8 (eight) hours as needed for bladder spasms.     oxyCODONE-acetaminophen 5-325 MG tablet  Commonly known as:  PERCOCET/ROXICET  Take 1 tablet by mouth every 4 (four) hours as needed for severe pain.     oxyCODONE-acetaminophen 5-325 MG tablet  Commonly known as:  PERCOCET  Take 1-2 tablets by mouth every 4 (four) hours as needed for moderate pain or severe pain.     promethazine 25 MG tablet  Commonly known as:  PHENERGAN  Take 1 tablet (25 mg total) by mouth every 6 (six) hours as needed for nausea or vomiting.      tamsulosin 0.4 MG Caps capsule  Commonly known as:  FLOMAX  Take 0.4 mg by mouth daily.        Followup:      Follow-up Information    Follow up with [name], MD In 1 week.   Specialty:  Urology   Why:  arrange for ureteroscopy   Contact information:   [address] [place] [address] [phone]

## 2015-08-08 NOTE — Discharge Instructions (Signed)
You have a ureteral stent in place.  This is a tube that extends from your kidney to your bladder.  This may cause urinary bleeding, burning with urination, and urinary frequency.  Please call our office or present to the ED if you develop fevers >101 or pain which is not able to be controlled with oral pain medications.  You may be given either Flomax and/ or ditropan to help with bladder spasms and stent pain in addition to pain medications.   ° °[facility] °[address], [address] °[place], [place] [address] °[phone] °

## 2015-08-08 NOTE — Progress Notes (Signed)
IV was removed. Discharge instructions, follow-up appointments, and prescriptions were provided to the pt and father at bedside. The pt walked out with his father.

## 2015-08-08 NOTE — Care Management (Signed)
Patient admitted with Renal calculus.  Patient lives at home with family.  Patient states that he is employed however is uninsured.  He plans to purchase out of pocket for insurance in the near future.    Patient was provided coupons for all of his discharge medications from [email or web address].  He requested from [facility].  Patient states that he will be able to afford all of his medications  Patient does not have PCP.  I offered to provided patient with [facility].  He has declined at this time.  RNCM signing off.

## 2015-08-09 LAB — CULTURE, URINE COMPREHENSIVE

## 2015-08-13 ENCOUNTER — Emergency Department: Payer: Self-pay

## 2015-08-13 ENCOUNTER — Emergency Department
Admission: EM | Admit: 2015-08-13 | Discharge: 2015-08-13 | Disposition: A | Discharge status: Home / Self Care | Payer: Self-pay | Attending: Emergency Medicine | Admitting: Emergency Medicine

## 2015-08-13 ENCOUNTER — Other Ambulatory Visit: Payer: Self-pay

## 2015-08-13 ENCOUNTER — Telehealth: Payer: Self-pay | Admitting: Urology

## 2015-08-13 DIAGNOSIS — Z87891 Personal history of nicotine dependence: Secondary | ICD-10-CM | POA: Insufficient documentation

## 2015-08-13 DIAGNOSIS — N50811 Right testicular pain: Secondary | ICD-10-CM

## 2015-08-13 DIAGNOSIS — N451 Epididymitis: Secondary | ICD-10-CM | POA: Insufficient documentation

## 2015-08-13 LAB — URINALYSIS COMPLETE WITH MICROSCOPIC (ARMC ONLY)
BACTERIA UA: NONE SEEN
BILIRUBIN URINE: NEGATIVE
GLUCOSE, UA: NEGATIVE mg/dL
Ketones, ur: NEGATIVE mg/dL
NITRITE: NEGATIVE
PH: 8 (ref 5.0–8.0)
Protein, ur: 30 mg/dL — AB
SPECIFIC GRAVITY, URINE: 1.014 (ref 1.005–1.030)
Squamous Epithelial / LPF: NONE SEEN

## 2015-08-13 MED ORDER — IBUPROFEN 600 MG PO TABS
600.0000 mg | ORAL_TABLET | ORAL | Status: AC
  Administered 2015-08-13: 600 mg via ORAL
  Filled 2015-08-13: qty 1

## 2015-08-13 MED ORDER — SULFAMETHOXAZOLE-TRIMETHOPRIM 800-160 MG PO TABS: 1.0000 | ORAL_TABLET | Freq: Two times a day (BID) | ORAL | Status: DC

## 2015-08-13 NOTE — ED Provider Notes (Signed)
[facility] Emergency Department Provider Note  ____________________________________________  Time seen: Approximately 5:21 PM  I have reviewed the triage vital signs and the nursing notes.   HISTORY  Chief Complaint Testicle Pain    HPI Donald Reid is a 30 y.o. male patient transfer evaluation of pain at the back of his right testicle for about the last 2 days.  Patient reports he had a dull achy feeling of fullness and some slight discomfort noted primarily around the back side of his right testicle for 2 days. He denies a ditch sharp or severe. He did notice like the right testicle seem just slightly more floppy than normal. He has not had any urethral discharge. No fevers or chills. He was and continues on treatment for urinary tract infection as well as had a renal stent placed about a few days ago for a kidney stone on the left. He reports having occasional twinges of pain, but no severe pain or discomfort related to that. He is experiencing some slight dysuria, but that has been ongoing issue since the time of the kidney stone.  He is sexually active, with one male partner, but reports not in active for about the last 2 months. No urethral discharge. No erections. No pain in the left testicle. No pain in the groin. No nausea or vomiting. No fevers or chills.   Past Medical History  Diagnosis Date  . Cough LAST 2 DAYS    OCCASIONAL GREEN SPUTUM  . Kidney stone 6-7- YRS AGO    DID NOT PASS STONE  . Anxiety     Patient Active Problem List   Diagnosis Date Noted  . Nephrolithiasis 08/07/2015  . Renal calculus, left 07/27/2015  . Intractable pain 07/27/2015  . Nausea and vomiting 07/27/2015    Past Surgical History  Procedure Laterality Date  . Inguinal hernia repair  05/30/2011    Procedure: HERNIA REPAIR INGUINAL ADULT;  Surgeon: Emelia Loron, MD;  Location: [facility] ORS;  Service: General;  Laterality: Right;  . Cystoscopy w/ retrogrades Left  08/07/2015    Procedure: CYSTOSCOPY WITH RETROGRADE PYELOGRAM;  Surgeon: Crist Fat, MD;  Location: [facility] ORS;  Service: Urology;  Laterality: Left;  . Cystoscopy with stent placement Left 08/07/2015    Procedure: CYSTOSCOPY WITH STENT PLACEMENT;  Surgeon: Crist Fat, MD;  Location: [facility] ORS;  Service: Urology;  Laterality: Left;    Current Outpatient Rx  Name  Route  Sig  Dispense  Refill  . ciprofloxacin (CIPRO) 500 MG tablet   Oral   Take 1 tablet (500 mg total) by mouth 2 (two) times daily.   14 tablet   0   . docusate sodium (COLACE) 100 MG capsule   Oral   Take 1 capsule (100 mg total) by mouth 2 (two) times daily.   60 capsule   0   . HYDROmorphone (DILAUDID) 2 MG tablet   Oral   Take 1 tablet (2 mg total) by mouth every 4 (four) hours as needed for moderate pain or severe pain.   20 tablet   0   . ibuprofen (ADVIL,MOTRIN) 200 MG tablet   Oral   Take 200-600 mg by mouth every 6 (six) hours as needed for fever, headache or mild pain.         Marland Kitchen ketorolac (TORADOL) 10 MG tablet   Oral   Take 1 tablet (10 mg total) by mouth every 6 (six) hours as needed for moderate pain.   20 tablet  0   . ondansetron (ZOFRAN ODT) 4 MG disintegrating tablet   Oral   Take 1 tablet (4 mg total) by mouth every 8 (eight) hours as needed for nausea or vomiting.   20 tablet   0   . oxybutynin (DITROPAN) 5 MG tablet   Oral   Take 1 tablet (5 mg total) by mouth every 8 (eight) hours as needed for bladder spasms.   30 tablet   0   . oxyCODONE-acetaminophen (PERCOCET) 5-325 MG tablet   Oral   Take 1-2 tablets by mouth every 4 (four) hours as needed for moderate pain or severe pain.   15 tablet   0   . oxyCODONE-acetaminophen (PERCOCET/ROXICET) 5-325 MG tablet   Oral   Take 1 tablet by mouth every 4 (four) hours as needed for severe pain. Patient not taking: Reported on 08/07/2015   30 tablet   0   . promethazine (PHENERGAN) 25 MG tablet   Oral   Take 1 tablet  (25 mg total) by mouth every 6 (six) hours as needed for nausea or vomiting. Patient not taking: Reported on 08/07/2015   12 tablet   1   . sulfamethoxazole-trimethoprim (BACTRIM DS,SEPTRA DS) 800-160 MG tablet   Oral   Take 1 tablet by mouth 2 (two) times daily.   20 tablet   0   . tamsulosin (FLOMAX) 0.4 MG CAPS capsule   Oral   Take 0.4 mg by mouth daily.           Allergies Review of patient's allergies indicates no known allergies.  Family History  Problem Relation Age of Onset  . Kidney disease Neg Hx   . Prostate cancer Neg Hx     Social History Social History  Substance Use Topics  . Smoking status: Former Smoker -- 1.00 packs/day for 11 years    Types: Cigarettes  . Smokeless tobacco: Never Used     Comment: quit 2 days  . Alcohol Use: No    Review of Systems Constitutional: No fever/chills Eyes: No visual changes. ENT: No sore throat. Cardiovascular: Denies chest pain. Respiratory: Denies shortness of breath. Gastrointestinal: No abdominal pain.  No nausea, no vomiting.  No diarrhea.  No constipation. Genitourinary: See history of present illness. No abnormal urine odor. Had some blood in his urine after having his stent placed, but this seems to have cleared up. Urinating normally now except for a slight discomfort which has been ongoing since that time he had his kidney stone. Musculoskeletal: Negative for back pain. Skin: Negative for rash. Neurological: Negative for headaches, focal weakness or numbness.  10-point ROS otherwise negative.  ____________________________________________   PHYSICAL EXAM:  VITAL SIGNS: ED Triage Vitals  Enc Vitals Group     BP 08/13/15 1629 140/88 mmHg     Pulse Rate 08/13/15 1629 74     Resp 08/13/15 1629 18     Temp 08/13/15 1629 98.2 F (36.8 C)     Temp Source 08/13/15 1629 Oral     SpO2 08/13/15 1629 100 %     Weight 08/13/15 1629 140 lb (63.504 kg)     Height 08/13/15 1629 5\' 6"  (1.676 m)     Head Cir  --      Peak Flow --      Pain Score 08/13/15 1630 10     Pain Loc --      Pain Edu? --      Excl. in GC? --    Constitutional: Alert and  oriented. Well appearing and in no acute distress. Eyes: Conjunctivae are normal. PERRL. EOMI. Head: Atraumatic. Nose: No congestion/rhinnorhea. Mouth/Throat: Mucous membranes are moist.  Oropharynx non-erythematous. Neck: No stridor.   Cardiovascular: Normal rate, regular rhythm. Grossly normal heart sounds.  Good peripheral circulation. Respiratory: Normal respiratory effort.  No retractions. Lungs CTAB. Gastrointestinal: Soft and nontender. No distention.   Normal, flaccid appearing circumcised penis. Nontender. Scrotum appears normal, with no edema or rash. The left testicle is nontender with some slight fullness noted posteriorly without tenderness. The right testicle itself does not appear to be tender, there is mild tenderness to palpation overlying the posterior region of the scrotum approximately the region I would expect for the epididymis to be. Normal cremaster reflexes.  Musculoskeletal: No lower extremity tenderness nor edema.  No joint effusions. Neurologic:  Normal speech and language. No gross focal neurologic deficits are appreciated.  Skin:  Skin is warm, dry and intact. No rash noted. Psychiatric: Mood and affect are normal. Speech and behavior are normal.  ____________________________________________   LABS (all labs ordered are listed, but only abnormal results are displayed)  Labs Reviewed  URINALYSIS COMPLETEWITH MICROSCOPIC ([facility] ONLY) - Abnormal; Notable for the following:    Color, Urine YELLOW (*)    APPearance HAZY (*)    Hgb urine dipstick 3+ (*)    Protein, ur 30 (*)    Leukocytes, UA 1+ (*)    All other components within normal limits  URINE CULTURE   ____________________________________________  EKG   ____________________________________________  RADIOLOGY  US Scrotum (Final result) Result time:  08/13/15 17:41:38   Final result by Rad Results In Interface (08/13/15 17:41:38)   Narrative:   CLINICAL DATA: Right testicular pain for 2 days.  EXAM: SCROTAL ULTRASOUND  DOPPLER ULTRASOUND OF THE TESTICLES  TECHNIQUE: Complete ultrasound examination of the testicles, epididymis, and other scrotal structures was performed. Color and spectral Doppler ultrasound were also utilized to evaluate blood flow to the testicles.  COMPARISON: None.  FINDINGS: Right testicle  Measurements: 5.5 x 2.7 x 3.3 cm. No mass or microlithiasis visualized. Normal blood flow.  Left testicle  Measurements: 4.7 x 2.6 x 3.7 cm. No mass or microlithiasis visualized. Normal blood flow.  Right epididymis: Normal in size and appearance. Normal blood flow.  Left epididymis: Normal in size. Epididymal head cysts measuring 7 x 8 x 8 and 5 x 7 x 6 mm. Normal blood flow.  Hydrocele: None visualized.  Varicocele: None visualized.  Pulsed Doppler interrogation of both testes demonstrates normal low resistance arterial and venous waveforms bilaterally.  IMPRESSION: 1. Left epididymal head cysts. 2. Otherwise normal sonographic appearance of the scrotum and testes. No testicular torsion.   Electronically Signed By: Rubye OaksMelanie Ehinger M.D. On: 08/13/2015 17:41          US Art/Ven Flow Abd Pelv Doppler (Final result) Result time: 08/13/15 17:41:38   Final result by Rad Results In Interface (08/13/15 17:41:38)   Narrative:   CLINICAL DATA: Right testicular pain for 2 days.  EXAM: SCROTAL ULTRASOUND  DOPPLER ULTRASOUND OF THE TESTICLES  TECHNIQUE: Complete ultrasound examination of the testicles, epididymis, and other scrotal structures was performed. Color and spectral Doppler ultrasound were also utilized to evaluate blood flow to the testicles.  COMPARISON: None.  FINDINGS: Right testicle  Measurements: 5.5 x 2.7 x 3.3 cm. No mass or microlithiasis visualized.  Normal blood flow.  Left testicle  Measurements: 4.7 x 2.6 x 3.7 cm. No mass or microlithiasis visualized. Normal blood flow.  Right epididymis:  Normal in size and appearance. Normal blood flow.  Left epididymis: Normal in size. Epididymal head cysts measuring 7 x 8 x 8 and 5 x 7 x 6 mm. Normal blood flow.  Hydrocele: None visualized.  Varicocele: None visualized.  Pulsed Doppler interrogation of both testes demonstrates normal low resistance arterial and venous waveforms bilaterally.  IMPRESSION: 1. Left epididymal head cysts. 2. Otherwise normal sonographic appearance of the scrotum and testes. No testicular torsion.   Electronically Signed By: Rubye Oaks M.D. On: 08/13/2015 17:41    ____________________________________________   PROCEDURES  Procedure(s) performed: None  Critical Care performed: No  ____________________________________________   INITIAL IMPRESSION / ASSESSMENT AND PLAN / ED COURSE  Pertinent labs & imaging results that were available during my care of the patient were reviewed by me and considered in my medical decision making (see chart for details).  Patient presents for evaluation of mild right posterior testicular pain. This is in the setting of recent instrumentation, including a urinary tract infection that grew pansensitive Escherichia coli for which she is on ciprofloxacin. He has a left ureteral stent in place, however his pain is only involving the right posterior testicle. Certainly this seems to be most consistent with a presentation of epididymitis, and I would suspect it may be related to his Escherichia coli UTI although the possibility of sexual transmitted infection is considered but felt to be fairly unlikely given his no activity in the last 2 months, a recent UTI with Escherichia coli and other considerations.  I will obtain an ultrasound of the scrotum to evaluate for testicular mass, abscess, and also exclude  torsion though I feel this is very unlikely. Plan to discuss this case with urology.  ----------------------------------------- 7:55 PM on 08/13/2015 -----------------------------------------  Patient resting comfortably, reports pain is essentially gone away. Discussed his case with Dr. Jacquelyne Balint who reports he is aware and knows of the patient. He recommends adding Bactrim DS one tab daily for 10 days, and recommends the patient follow up Thursday with urology for further evaluation. The patient already has an appointment on Thursday.  Return precautions and treatment recommendations and follow-up discussed with the patient who is agreeable with the plan.  ____________________________________________   FINAL CLINICAL IMPRESSION(S) / ED DIAGNOSES  Final diagnoses:  Epididymitis, right      Sharyn Creamer, MD 08/13/15 1956

## 2015-08-13 NOTE — Telephone Encounter (Signed)
Spoke with pt in reference to testicle pain. Pt stated that his testicles are in fact purple with radiating pain. Pt stated that he has previously had torsion and the pain is like the pain he felt then. Made pt aware he should go to the ER. Pt voiced understanding.

## 2015-08-13 NOTE — Telephone Encounter (Signed)
Pt is having pain in right testicle, tender and veins around it feel like they are swollen.  It feels like this is going into his bladder.  This is on right side, kidney stone is on his left side.  His testicles appear to be purple.  Can you please give pt a call.  [phone]  Dr Marlou PorchHerrick did surgery, and pt is scheduled for another surgery.  He has an appt this Thursday.

## 2015-08-13 NOTE — ED Notes (Signed)
Discharge instructions reviewed with patient. Patient verbalized understanding. Patient ambulated to lobby without difficulty.   

## 2015-08-13 NOTE — ED Notes (Signed)
Pt c/o right testicular pain since Saturday and today having blue discoloration, states he had renal stent placement 7/11

## 2015-08-13 NOTE — Discharge Instructions (Signed)
Epididymitis °Epididymitis is swelling (inflammation) of the epididymis. The epididymis is a cord-like structure that is located along the top and back part of the testicle. It collects and stores sperm from the testicle. °This condition can also cause pain and swelling of the testicle and scrotum. Symptoms usually start suddenly (acute epididymitis). Sometimes epididymitis starts gradually and lasts for a while (chronic epididymitis). This type may be harder to treat. °CAUSES °In men 35 and younger, this condition is usually caused by a bacterial infection or sexually transmitted disease (STD), such as: °· Gonorrhea. °· Chlamydia.   °In men 35 and older who do not have anal sex, this condition is usually caused by bacteria from a blockage or abnormalities in the urinary system. These can result from: °· Having a tube placed into the bladder (urinary catheter). °· Having an enlarged or inflamed prostate gland. °· Having recent urinary tract surgery. °In men who have a condition that weakens the body's defense system (immune system), such as HIV, this condition can be caused by:  °· Other bacteria, including tuberculosis and syphilis. °· Viruses. °· Fungi. °Sometimes this condition occurs without infection. That may happen if urine flows backward into the epididymis after heavy lifting or straining. °RISK FACTORS °This condition is more likely to develop in men: °· Who have unprotected sex with more than one partner. °· Who have anal sex.   °· Who have recently had surgery.   °· Who have a urinary catheter. °· Who have urinary problems. °· Who have a suppressed immune system.   °SYMPTOMS  °This condition usually begins suddenly with chills, fever, and pain behind the scrotum and in the testicle. Other symptoms include:  °· Swelling of the scrotum, testicle, or both. °· Pain when ejaculating or urinating. °· Pain in the back or belly. °· Nausea. °· Itching and discharge from the penis. °· Frequent need to pass  urine. °· Redness and tenderness of the scrotum. °DIAGNOSIS °Your health care provider can diagnose this condition based on your symptoms and medical history. Your health care provider will also do a physical exam to ask about your symptoms and check your scrotum and testicle for swelling, pain, and redness. You may also have other tests, including:   °· Examination of discharge from the penis. °· Urine tests for infections, such as STDs.   °Your health care provider may test you for other STDs, including HIV.  °TREATMENT °Treatment for this condition depends on the cause. If your condition is caused by a bacterial infection, oral antibiotic medicine may be prescribed. If the bacterial infection has spread to your blood, you may need to receive IV antibiotics. Nonbacterial epididymitis is treated with home care that includes bed rest and elevation of the scrotum. °Surgery may be needed to treat: °· Bacterial epididymitis that causes pus to build up in the scrotum (abscess). °· Chronic epididymitis that has not responded to other treatments. °HOME CARE INSTRUCTIONS °Medicines  °· Take over-the-counter and prescription medicines only as told by your health care provider.   °· If you were prescribed an antibiotic medicine, take it as told by your health care provider. Do not stop taking the antibiotic even if your condition improves. °Sexual Activity  °· If your epididymitis was caused by an STD, avoid sexual activity until your treatment is complete. °· Inform your sexual partner or partners if you test positive for an STD. They may need to be treated. Do not engage in sexual activity with your partner or partners until their treatment is completed. °General Instructions  °· Return to your normal activities as told   by your health care provider. Ask your health care provider what activities are safe for you. °· Keep your scrotum elevated and supported while resting. Ask your health care provider if you should wear a  scrotal support, such as a jockstrap. Wear it as told by your health care provider. °· If directed, apply ice to the affected area:   °¨ Put ice in a plastic bag. °¨ Place a towel between your skin and the bag. °¨ Leave the ice on for 20 minutes, 2-3 times per day. °· Try taking a sitz bath to help with discomfort. This is a warm water bath that is taken while you are sitting down. The water should only come up to your hips and should cover your buttocks. Do this 3-4 times per day or as told by your health care provider. °· Keep all follow-up visits as told by your health care provider. This is important. °SEEK MEDICAL CARE IF:  °· You have a fever.   °· Your pain medicine is not helping.   °· Your pain is getting worse.   °· Your symptoms do not improve within three days. °  °This information is not intended to replace advice given to you by your health care provider. Make sure you discuss any questions you have with your health care provider. °  °Document Released: 01/11/2000 Document Revised: 10/04/2014 Document Reviewed: 05/31/2014 °Elsevier Interactive Patient Education ©2016 [employer] ° °

## 2015-08-14 LAB — URINE CULTURE
Culture: NO GROWTH
SPECIAL REQUESTS: NORMAL

## 2015-08-16 ENCOUNTER — Encounter: Payer: Self-pay | Admitting: Urology

## 2015-08-16 ENCOUNTER — Ambulatory Visit (INDEPENDENT_AMBULATORY_CARE_PROVIDER_SITE_OTHER): Payer: Self-pay | Admitting: Urology

## 2015-08-16 ENCOUNTER — Other Ambulatory Visit: Payer: Self-pay

## 2015-08-16 ENCOUNTER — Encounter: Payer: Self-pay | Admitting: *Deleted

## 2015-08-16 VITALS — BP 101/68 | HR 70 | Ht 67.0 in | Wt 125.3 lb

## 2015-08-16 DIAGNOSIS — N200 Calculus of kidney: Secondary | ICD-10-CM

## 2015-08-16 NOTE — Patient Instructions (Signed)
  Your procedure is scheduled on: 08-22-15 Report to Same Day Surgery 2nd floor medical mall To find out your arrival time please call [phone] between 1PM - 3PM on 08-21-15  Remember: Instructions that are not followed completely may result in serious medical risk, up to and including death, or upon the discretion of your surgeon and anesthesiologist your surgery may need to be rescheduled.    _x___ 1. Do not eat food or drink liquids after midnight. No gum chewing or hard candies.     __x__ 2. No Alcohol for 24 hours before or after surgery.   __x__3. No Smoking for 24 prior to surgery.   ____  4. Bring all medications with you on the day of surgery if instructed.    __x__ 5. Notify your doctor if there is any change in your medical condition     (cold, fever, infections).     Do not wear jewelry, make-up, hairpins, clips or nail polish.  Do not wear lotions, powders, or perfumes. You may wear deodorant.  Do not shave 48 hours prior to surgery. Men may shave face and neck.  Do not bring valuables to the hospital.    [facility] is not responsible for any belongings or valuables.               Contacts, dentures or bridgework may not be worn into surgery.  Leave your suitcase in the car. After surgery it may be brought to your room.  For patients admitted to the hospital, discharge time is determined by your treatment team.   Patients discharged the day of surgery will not be allowed to drive home.    Please read over the following fact sheets that you were given:   [facility] Preparing for Surgery and or MRSA Information   _x___ Take these medicines the morning of surgery with A SIP OF WATER:    1. FLOMAX  2. MAY TAKE OXYCODONE FOR PAIN IF NEEDED  3.  4.  5.  6.  ____ Fleet Enema (as directed)   ____ Use CHG Soap or sage wipes as directed on instruction sheet   ____ Use inhalers on the day of surgery and bring to hospital day of surgery  ____ Stop metformin 2  days prior to surgery    ____ Take 1/2 of usual insulin dose the night before surgery and none on the morning of surgery.   ____ Stop aspirin or coumadin, or plavix  _x__ Stop Anti-inflammatories such as Advil, Aleve, Ibuprofen, Motrin, Naproxen,          Naprosyn, Goodies powders or aspirin products NOW. Ok to take Tylenol.   ____ Stop supplements until after surgery.    ____ Bring C-Pap to the hospital.

## 2015-08-16 NOTE — Progress Notes (Signed)
08/16/2015 11:49 AM   Donald Reid [birth year] [id number]  Referring provider: No referring provider defined for this encounter.  Chief Complaint  Patient presents with  . Follow-up    Epididymitis, right, Stent     HPI: The patient is a 30 year old male who recently underwent a left ureteral stent placement for a 4 mm stone in the setting of a urinary tract infection. He did develop a fever postoperatively so was admitted. He presents today to discuss definitive stone management.     PMH: Past Medical History  Diagnosis Date  . Cough LAST 2 DAYS    OCCASIONAL GREEN SPUTUM  . Kidney stone 6-7- YRS AGO    DID NOT PASS STONE  . Anxiety     Surgical History: Past Surgical History  Procedure Laterality Date  . Inguinal hernia repair  05/30/2011    Procedure: HERNIA REPAIR INGUINAL ADULT;  Surgeon: Emelia Loron, MD;  Location: [facility];  Service: General;  Laterality: Right;  . Cystoscopy w/ retrogrades Left 08/07/2015    Procedure: CYSTOSCOPY WITH RETROGRADE PYELOGRAM;  Surgeon: Crist Fat, MD;  Location: [facility];  Service: Urology;  Laterality: Left;  . Cystoscopy with stent placement Left 08/07/2015    Procedure: CYSTOSCOPY WITH STENT PLACEMENT;  Surgeon: Crist Fat, MD;  Location: [facility];  Service: Urology;  Laterality: Left;    Home Medications:    Medication List       This list is accurate as of: 08/16/15 11:49 AM.  Always use your most recent med list.               ciprofloxacin 500 MG tablet  Commonly known as:  CIPRO  Take 1 tablet (500 mg total) by mouth 2 (two) times daily.     docusate sodium 100 MG capsule  Commonly known as:  COLACE  Take 1 capsule (100 mg total) by mouth 2 (two) times daily.     HYDROmorphone 2 MG tablet  Commonly known as:  DILAUDID  Take 1 tablet (2 mg total) by mouth every 4 (four) hours as needed for moderate pain or severe pain.     ibuprofen 200 MG tablet  Commonly known as:  ADVIL,MOTRIN  Take  200-600 mg by mouth every 6 (six) hours as needed for fever, headache or mild pain. Reported on 08/16/2015     ketorolac 10 MG tablet  Commonly known as:  TORADOL  Take 1 tablet (10 mg total) by mouth every 6 (six) hours as needed for moderate pain.     ondansetron 4 MG disintegrating tablet  Commonly known as:  ZOFRAN ODT  Take 1 tablet (4 mg total) by mouth every 8 (eight) hours as needed for nausea or vomiting.     oxybutynin 5 MG tablet  Commonly known as:  DITROPAN  Take 1 tablet (5 mg total) by mouth every 8 (eight) hours as needed for bladder spasms.     oxyCODONE-acetaminophen 5-325 MG tablet  Commonly known as:  PERCOCET/ROXICET  Take 1 tablet by mouth every 4 (four) hours as needed for severe pain.     oxyCODONE-acetaminophen 5-325 MG tablet  Commonly known as:  PERCOCET  Take 1-2 tablets by mouth every 4 (four) hours as needed for moderate pain or severe pain.     promethazine 25 MG tablet  Commonly known as:  PHENERGAN  Take 1 tablet (25 mg total) by mouth every 6 (six) hours as needed for nausea or vomiting.     sulfamethoxazole-trimethoprim 800-160  MG tablet  Commonly known as:  BACTRIM DS,SEPTRA DS  Take 1 tablet by mouth 2 (two) times daily.     tamsulosin 0.4 MG Caps capsule  Commonly known as:  FLOMAX  Take 0.4 mg by mouth daily.        Allergies: No Known Allergies  Family History: Family History  Problem Relation Age of Onset  . Kidney disease Neg Hx   . Prostate cancer Neg Hx     Social History:  reports that he has quit smoking. His smoking use included Cigarettes. He has a 11 pack-year smoking history. He has never used smokeless tobacco. He reports that he uses illicit drugs (Marijuana). He reports that he does not drink alcohol.  ROS: UROLOGY Frequent Urination?: Yes Hard to postpone urination?: Yes Burning/pain with urination?: No Get up at night to urinate?: Yes Leakage of urine?: No Urine stream starts and stops?: No Trouble starting  stream?: No Do you have to strain to urinate?: No Blood in urine?: No Urinary tract infection?: No Sexually transmitted disease?: No Injury to kidneys or bladder?: No Painful intercourse?: No Weak stream?: No Erection problems?: No Penile pain?: No  Gastrointestinal Nausea?: No Vomiting?: No Indigestion/heartburn?: No Diarrhea?: No Constipation?: No  Constitutional Fever: No Night sweats?: No Weight loss?: No Fatigue?: No  Skin Skin rash/lesions?: No Itching?: No  Eyes Blurred vision?: No Double vision?: No  Ears/Nose/Throat Sore throat?: No  Hematologic/Lymphatic Swollen glands?: No Easy bruising?: No  Cardiovascular Leg swelling?: No Chest pain?: No  Respiratory Cough?: No Shortness of breath?: No  Endocrine Excessive thirst?: No  Musculoskeletal Back pain?: No Joint pain?: No  Neurological Headaches?: No Dizziness?: No  Psychologic Depression?: No Anxiety?: No  Physical Exam: BP 101/68 mmHg  Pulse 70  Ht 5\' 7"  (1.702 m)  Wt 125 lb 4.8 oz (56.836 kg)  BMI 19.62 kg/m2  Constitutional:  Alert and oriented, No acute distress. HEENT: [place] AT, moist mucus membranes.  Trachea midline, no masses. Cardiovascular: No clubbing, cyanosis, or edema. Respiratory: Normal respiratory effort, no increased work of breathing. GI: Abdomen is soft, nontender, nondistended, no abdominal masses GU: No CVA tenderness. Skin: No rashes, bruises or suspicious lesions. Lymph: No cervical or inguinal adenopathy. Neurologic: Grossly intact, no focal deficits, moving all 4 extremities. Psychiatric: Normal mood and affect.  Laboratory Data: Lab Results  Component Value Date   WBC 7.9 07/28/2015   HGB 12.8* 07/28/2015   HCT 36.5* 07/28/2015   MCV 90.8 07/28/2015   PLT 167 07/28/2015    Lab Results  Component Value Date   CREATININE 0.89 07/28/2015    No results found for: PSA  No results found for: TESTOSTERONE  No results found for:  HGBA1C  Urinalysis    Component Value Date/Time   COLORURINE YELLOW* 08/13/2015 1754   APPEARANCEUR HAZY* 08/13/2015 1754   APPEARANCEUR Cloudy* 08/07/2015 0922   LABSPEC 1.014 08/13/2015 1754   PHURINE 8.0 08/13/2015 1754   GLUCOSEU NEGATIVE 08/13/2015 1754   HGBUR 3+* 08/13/2015 1754   BILIRUBINUR NEGATIVE 08/13/2015 1754   BILIRUBINUR Negative 08/07/2015 0922   KETONESUR NEGATIVE 08/13/2015 1754   PROTEINUR 30* 08/13/2015 1754   PROTEINUR 3+* 08/07/2015 0922   NITRITE NEGATIVE 08/13/2015 1754   NITRITE Positive* 08/07/2015 0922   LEUKOCYTESUR 1+* 08/13/2015 1754   LEUKOCYTESUR 1+* 08/07/2015 0922     Assessment & Plan:    1. Left ureteral stone I discussed the risks, benefits, indications of left ureteroscopy with laser lithotripsy to remove his stone. We discussed  the risks include but to bleeding, infection, iatrogenic injury, need for repeat procedures. All questions were answered. The patient has elected to undergo cystoscopy, left ureteroscopy, laser lithotripsy, left ureteral stent exchange.   Hildred Laser, MD  [facility] [address], [address] [place], [place] [address] [phone]

## 2015-08-19 LAB — CULTURE, URINE COMPREHENSIVE

## 2015-08-20 ENCOUNTER — Ambulatory Visit: Payer: Self-pay | Admitting: Urology

## 2015-08-22 ENCOUNTER — Ambulatory Visit: Payer: Self-pay | Admitting: Anesthesiology

## 2015-08-22 ENCOUNTER — Encounter
Admission: RE | Disposition: A | Discharge status: Home / Self Care | Payer: Self-pay | Source: Ambulatory Visit | Attending: Urology

## 2015-08-22 ENCOUNTER — Encounter: Payer: Self-pay | Admitting: *Deleted

## 2015-08-22 ENCOUNTER — Ambulatory Visit
Admission: RE | Admit: 2015-08-22 | Discharge: 2015-08-22 | Disposition: A | Discharge status: Home / Self Care | Payer: Self-pay | Source: Ambulatory Visit | Attending: Urology | Admitting: Urology

## 2015-08-22 DIAGNOSIS — Z87442 Personal history of urinary calculi: Secondary | ICD-10-CM | POA: Insufficient documentation

## 2015-08-22 DIAGNOSIS — Z9889 Other specified postprocedural states: Secondary | ICD-10-CM | POA: Insufficient documentation

## 2015-08-22 DIAGNOSIS — Z87891 Personal history of nicotine dependence: Secondary | ICD-10-CM | POA: Insufficient documentation

## 2015-08-22 DIAGNOSIS — N201 Calculus of ureter: Secondary | ICD-10-CM | POA: Insufficient documentation

## 2015-08-22 DIAGNOSIS — Z79891 Long term (current) use of opiate analgesic: Secondary | ICD-10-CM | POA: Insufficient documentation

## 2015-08-22 DIAGNOSIS — Z79899 Other long term (current) drug therapy: Secondary | ICD-10-CM | POA: Insufficient documentation

## 2015-08-22 DIAGNOSIS — Z791 Long term (current) use of non-steroidal anti-inflammatories (NSAID): Secondary | ICD-10-CM | POA: Insufficient documentation

## 2015-08-22 HISTORY — PX: CYSTOSCOPY W/ URETERAL STENT PLACEMENT: SHX1429

## 2015-08-22 HISTORY — PX: URETEROSCOPY WITH HOLMIUM LASER LITHOTRIPSY: SHX6645

## 2015-08-22 LAB — URINE DRUG SCREEN, QUALITATIVE (ARMC ONLY)
AMPHETAMINES, UR SCREEN: NOT DETECTED
Barbiturates, Ur Screen: NOT DETECTED
Benzodiazepine, Ur Scrn: NOT DETECTED
Cannabinoid 50 Ng, Ur ~~LOC~~: POSITIVE — AB
Cocaine Metabolite,Ur ~~LOC~~: NOT DETECTED
MDMA (ECSTASY) UR SCREEN: NOT DETECTED
METHADONE SCREEN, URINE: NOT DETECTED
Opiate, Ur Screen: NOT DETECTED
Phencyclidine (PCP) Ur S: NOT DETECTED
TRICYCLIC, UR SCREEN: NOT DETECTED

## 2015-08-22 SURGERY — URETEROSCOPY, WITH LITHOTRIPSY USING HOLMIUM LASER
Anesthesia: General | Laterality: Left | Wound class: Clean Contaminated

## 2015-08-22 MED ORDER — FENTANYL CITRATE (PF) 100 MCG/2ML IJ SOLN
25.0000 ug | INTRAMUSCULAR | Status: DC | PRN
Start: 2015-08-22 — End: 2015-08-22
  Administered 2015-08-22: 50 ug via INTRAVENOUS

## 2015-08-22 MED ORDER — SODIUM CHLORIDE 0.9 % IV SOLN
2.0000 g | Freq: Once | INTRAVENOUS | Status: AC
  Administered 2015-08-22: 2 g via INTRAVENOUS
  Filled 2015-08-22: qty 2000

## 2015-08-22 MED ORDER — LIDOCAINE HCL (CARDIAC) 20 MG/ML IV SOLN
INTRAVENOUS | Status: DC | PRN
  Administered 2015-08-22: 40 mg via INTRAVENOUS

## 2015-08-22 MED ORDER — FENTANYL CITRATE (PF) 100 MCG/2ML IJ SOLN
INTRAMUSCULAR | Status: DC | PRN
  Administered 2015-08-22 (×2): 75 ug via INTRAVENOUS

## 2015-08-22 MED ORDER — FAMOTIDINE 20 MG PO TABS
20.0000 mg | ORAL_TABLET | Freq: Once | ORAL | Status: AC
  Administered 2015-08-22: 20 mg via ORAL

## 2015-08-22 MED ORDER — SULFAMETHOXAZOLE-TRIMETHOPRIM 800-160 MG PO TABS: 1.0000 | ORAL_TABLET | Freq: Two times a day (BID) | ORAL | 0 refills | Status: DC

## 2015-08-22 MED ORDER — MEPERIDINE HCL 25 MG/ML IJ SOLN: 6.2500 mg | INTRAMUSCULAR | Status: DC | PRN

## 2015-08-22 MED ORDER — OXYCODONE HCL 5 MG PO TABS
ORAL_TABLET | ORAL | Status: AC
  Filled 2015-08-22: qty 1

## 2015-08-22 MED ORDER — OXYCODONE HCL 5 MG/5ML PO SOLN
5.0000 mg | Freq: Once | ORAL | Status: AC | PRN
Start: 2015-08-22 — End: 2015-08-22

## 2015-08-22 MED ORDER — FENTANYL CITRATE (PF) 100 MCG/2ML IJ SOLN
INTRAMUSCULAR | Status: AC
  Administered 2015-08-22: 50 ug via INTRAVENOUS
  Filled 2015-08-22: qty 2

## 2015-08-22 MED ORDER — MIDAZOLAM HCL 2 MG/2ML IJ SOLN
INTRAMUSCULAR | Status: DC | PRN
  Administered 2015-08-22: 2 mg via INTRAVENOUS

## 2015-08-22 MED ORDER — OXYCODONE-ACETAMINOPHEN 5-325 MG PO TABS: 1.0000 | ORAL_TABLET | ORAL | 0 refills | Status: DC | PRN

## 2015-08-22 MED ORDER — OXYCODONE HCL 5 MG PO TABS
5.0000 mg | ORAL_TABLET | Freq: Once | ORAL | Status: AC | PRN
  Administered 2015-08-22: 5 mg via ORAL

## 2015-08-22 MED ORDER — GENTAMICIN SULFATE 40 MG/ML IJ SOLN
120.0000 mg | Freq: Once | INTRAVENOUS | Status: AC
  Administered 2015-08-22: 120 mg via INTRAVENOUS
  Filled 2015-08-22: qty 3

## 2015-08-22 MED ORDER — FAMOTIDINE 20 MG PO TABS
ORAL_TABLET | ORAL | Status: AC
  Administered 2015-08-22: 20 mg via ORAL
  Filled 2015-08-22: qty 1

## 2015-08-22 MED ORDER — ONDANSETRON HCL 4 MG/2ML IJ SOLN
INTRAMUSCULAR | Status: DC | PRN
  Administered 2015-08-22: 4 mg via INTRAVENOUS

## 2015-08-22 MED ORDER — KETOROLAC TROMETHAMINE 30 MG/ML IJ SOLN
INTRAMUSCULAR | Status: DC | PRN
  Administered 2015-08-22: 30 mg via INTRAVENOUS

## 2015-08-22 MED ORDER — PROPOFOL 10 MG/ML IV BOLUS
INTRAVENOUS | Status: DC | PRN
  Administered 2015-08-22: 40 mg via INTRAVENOUS
  Administered 2015-08-22: 150 mg via INTRAVENOUS

## 2015-08-22 MED ORDER — LACTATED RINGERS IV SOLN
INTRAVENOUS | Status: DC
  Administered 2015-08-22 (×2): via INTRAVENOUS

## 2015-08-22 MED ORDER — PROMETHAZINE HCL 25 MG/ML IJ SOLN: 6.2500 mg | INTRAMUSCULAR | Status: DC | PRN

## 2015-08-22 SURGICAL SUPPLY — 30 items
BACTOSHIELD CHG 4% 4OZ (MISCELLANEOUS)
BASKET ZERO TIP 1.9FR (BASKET) ×2 IMPLANT
CATH URETL 5X70 OPEN END (CATHETERS) ×2 IMPLANT
CNTNR SPEC 2.5X3XGRAD LEK (MISCELLANEOUS) ×1
CONT SPEC 4OZ STER OR WHT (MISCELLANEOUS) ×1
CONTAINER SPEC 2.5X3XGRAD LEK (MISCELLANEOUS) ×1 IMPLANT
FEE TECHNICIAN ONLY PER HOUR (MISCELLANEOUS) IMPLANT
FIBER LASER LITHO 273 (Laser) ×2 IMPLANT
GLOVE BIO SURGEON STRL SZ7 (GLOVE) ×2 IMPLANT
GLOVE BIO SURGEON STRL SZ7.5 (GLOVE) ×2 IMPLANT
GOWN STRL REUS W/ TWL LRG LVL4 (GOWN DISPOSABLE) ×1 IMPLANT
GOWN STRL REUS W/TWL LRG LVL4 (GOWN DISPOSABLE) ×1
GOWN STRL REUS W/TWL XL LVL3 (GOWN DISPOSABLE) ×2 IMPLANT
GUIDEWIRE SUPER STIFF (WIRE) IMPLANT
INTRODUCER DILATOR DOUBLE (INTRODUCER) ×2 IMPLANT
KIT RM TURNOVER CYSTO AR (KITS) ×2 IMPLANT
LASER FIBER 200M SMARTSCOPE (Laser) IMPLANT
PACK CYSTO AR (MISCELLANEOUS) ×2 IMPLANT
SCRUB CHG 4% DYNA-HEX 4OZ (MISCELLANEOUS) IMPLANT
SENSORWIRE 0.038 NOT ANGLED (WIRE) ×2
SET CYSTO W/LG BORE CLAMP LF (SET/KITS/TRAYS/PACK) ×2 IMPLANT
SHEATH URETERAL 13/15X36 1L (SHEATH) IMPLANT
SOL .9 NS 3000ML IRR  AL (IV SOLUTION) ×1
SOL .9 NS 3000ML IRR UROMATIC (IV SOLUTION) ×1 IMPLANT
STENT URET 6FRX24 CONTOUR (STENTS) IMPLANT
STENT URET 6FRX26 CONTOUR (STENTS) IMPLANT
SURGILUBE 2OZ TUBE FLIPTOP (MISCELLANEOUS) ×2 IMPLANT
SYRINGE IRR TOOMEY STRL 70CC (SYRINGE) ×2 IMPLANT
WATER STERILE IRR 1000ML POUR (IV SOLUTION) ×2 IMPLANT
WIRE SENSOR 0.038 NOT ANGLED (WIRE) ×1 IMPLANT

## 2015-08-22 NOTE — Anesthesia Postprocedure Evaluation (Signed)
Anesthesia Post Note  Patient: Donald Reid  Procedure(s) Performed: Procedure(s) (LRB): URETEROSCOPY WITH HOLMIUM LASER LITHOTRIPSY (Left) CYSTOSCOPY WITH STENT REPLACEMENT (Left)  Patient location during evaluation: PACU Anesthesia Type: General Level of consciousness: awake and alert and oriented Pain management: pain level controlled Vital Signs Assessment: post-procedure vital signs reviewed and stable Respiratory status: spontaneous breathing, nonlabored ventilation and respiratory function stable Cardiovascular status: blood pressure returned to baseline and stable Postop Assessment: no signs of nausea or vomiting Anesthetic complications: no    Last Vitals:  Vitals:   08/22/15 1230 08/22/15 1248  BP: 114/88 109/65  Pulse: 62 (!) 58  Resp: 18 14  Temp: 36.7 C 36.4 C    Last Pain:  Vitals:   08/22/15 1248  TempSrc: Oral  PainSc: 4                  Shamiya Demeritt

## 2015-08-22 NOTE — Interval H&P Note (Signed)
History and Physical Interval Note:  08/22/2015 10:16 AM  Donald Reid  has presented today for surgery, with the diagnosis of LEFT URETERAL STONE  The various methods of treatment have been discussed with the patient and family. After consideration of risks, benefits and other options for treatment, the patient has consented to  Procedure(s): URETEROSCOPY WITH HOLMIUM LASER LITHOTRIPSY (Left) CYSTOSCOPY WITH STENT REPLACEMENT (Left) as a surgical intervention .  The patient's history has been reviewed, patient examined, no change in status, stable for surgery.  I have reviewed the patient's chart and labs.  Questions were answered to the patient's satisfaction.    RRR Unlabored resp  Hildred Laser

## 2015-08-22 NOTE — Discharge Instructions (Addendum)
Ureteral Stent Implantation, Care After Refer to this sheet in the next few weeks. These instructions provide you with information on caring for yourself after your procedure. Your health care provider may also give you more specific instructions. Your treatment has been planned according to current medical practices, but problems sometimes occur. Call your health care provider if you have any problems or questions after your procedure. WHAT TO EXPECT AFTER THE PROCEDURE You should be back to normal activity within 48 hours after the procedure. Nausea and vomiting may occur and are commonly the result of anesthesia. It is common to experience sharp pain in the back or lower abdomen and penis with voiding. This is caused by movement of the ends of the stent with the act of urinating.It usually goes away within minutes after you have stopped urinating. HOME CARE INSTRUCTIONS Make sure to drink plenty of fluids. You may have small amounts of bleeding, causing your urine to be red. This is normal. Certain movements may trigger pain or a feeling that you need to urinate. You may be given medicines to prevent infection or bladder spasms. Be sure to take all medicines as directed. Only take over-the-counter or prescription medicines for pain, discomfort, or fever as directed by your health care provider. Do not take aspirin, as this can make bleeding worse. Your stent will be left in until the blockage is resolved. This may take 2 weeks or longer, depending on the reason for stent implantation. You may have an X-ray exam to make sure your ureter is open and that the stent has not moved out of position (migrated). The stent can be removed by your health care provider in the office. Medicines may be given for comfort while the stent is being removed. Be sure to keep all follow-up appointments so your health care provider can check that you are healing properly. SEEK MEDICAL CARE IF:  You experience increasing  pain.  Your pain medicine is not working. SEEK IMMEDIATE MEDICAL CARE IF:  Your urine is dark red or has blood clots.  You are leaking urine (incontinent).  You have a fever, chills, feeling sick to your stomach (nausea), or vomiting.  Your pain is not relieved by pain medicine.  The end of the stent comes out of the urethra.  You are unable to urinate.   This information is not intended to replace advice given to you by your health care provider. Make sure you discuss any questions you have with your health care provider.   Document Released: 09/15/2012 Document Revised: 01/18/2013 Document Reviewed: 07/28/2014 Elsevier Interactive Patient Education 2016 [employer] Inc[employer]   Ureteral Stent Implantation Ureteral stent implantation is the implantation of a soft plastic tube with multiple holes into the tube that drains urine from your kidney to your bladder (ureter). The stent helps drain your kidney when there is a blockage of the flow of urine in your ureter. The stent has a coil on each end to keep it from falling out. One end stays in the kidney. The other end stays in the bladder. It is most often taken out after any blockage has been removed or your ureter has healed. Short-term stents have a string attached to make removal quite easy. Removal of a short-term stent can be done in your health care provider's office or by you at home. Long-term stents need to be changed every few months. LET [facility] CARE PROVIDER KNOW ABOUT:  Any allergies you have.  All medicines you are taking, including  vitamins, herbs, eye drops, creams, and over-the-counter medicines.  Previous problems you or members of your family have had with the use of anesthetics.  Any blood disorders you have.  Previous surgeries you have had.  Medical conditions you have. RISKS AND COMPLICATIONS Generally, ureteral stent implantation is a safe procedure. However, as with any procedure, complications can occur.  Possible complications include:  Movement of the stent away from where it was originally placed (migration). This may affect the ability of the stent to properly drain your kidney. If migration of the stent occurs, the stent may need to be replaced or repositioned.  Perforation of the ureter.  Infection. BEFORE THE PROCEDURE  You may be asked to wash your genital area with sterile soap the morning of your procedure.  You may be given an oral antibiotic which you should take with a sip of water as prescribed by your health care provider.  You may be asked to not eat or drink for 8 hours before the surgery. PROCEDURE  First you will be given an anesthetic so you do not feel pain during the procedure.  Your health care provider will insert a special lighted instrument called a cystoscope into your bladder. This allows your health care provider to see the opening to your ureter.  A thin wire is carefully threaded into your bladder and up the ureter. The stent is inserted over the wire and the wire is then removed.  Your bladder will be emptied of urine. AFTER THE PROCEDURE You will be taken to a recovery room until it is okay for you to go home.   This information is not intended to replace advice given to you by your health care provider. Make sure you discuss any questions you have with your health care provider.   Document Released: 01/11/2000 Document Revised: 01/18/2013 Document Reviewed: 07/28/2014 Elsevier Interactive Patient Education 2016 Else[employer] Inc.  General Anesthesia, Adult, Care After Refer to this sheet in the next few weeks. These instructions provide you with information on caring for yourself after your procedure. Your health care provider may also give you more specific instructions. Your treatment has been planned according to current medical practices, but problems sometimes occur. Call your health care provider if you have any problems or questions after your  procedure. WHAT TO EXPECT AFTER THE PROCEDURE After the procedure, it is typical to experience:  Sleepiness.  Nausea and vomiting. HOME CARE INSTRUCTIONS  For the first 24 hours after general anesthesia:  Have a responsible person with you.  Do not drive a car. If you are alone, do not take public transportation.  Do not drink alcohol.  Do not take medicine that has not been prescribed by your health care provider.  Do not sign important papers or make important decisions.  You may resume a normal diet and activities as directed by your health care provider.  Change bandages (dressings) as directed.  If you have questions or problems that seem related to general anesthesia, call the hospital and ask for the anesthetist or anesthesiologist on call. SEEK MEDICAL CARE IF:  You have nausea and vomiting that continue the day after anesthesia.  You develop a rash. SEEK IMMEDIATE MEDICAL CARE IF:   You have difficulty breathing.  You have chest pain.  You have any allergic problems.   This information is not intended to replace advice given to you by your health care provider. Make sure you discuss any questions you have with your health care provider.  Document Released: 04/21/2000 Document Revised: 02/03/2014 Document Reviewed: 05/14/2011 Elsevier Interactive Patient Education [employer]

## 2015-08-22 NOTE — Anesthesia Procedure Notes (Signed)
Procedure Name: LMA Insertion Date/Time: 08/22/2015 11:15 AM Performed by: Henrietta Hoover Pre-anesthesia Checklist: Patient identified, Emergency Drugs available, Suction available, Patient being monitored and Timeout performed Patient Re-evaluated:Patient Re-evaluated prior to inductionOxygen Delivery Method: Circle system utilized Preoxygenation: Pre-oxygenation with 100% oxygen Intubation Type: IV induction Ventilation: Mask ventilation without difficulty LMA: LMA inserted LMA Size: 4.0 Number of attempts: 1 Tube secured with: Tape

## 2015-08-22 NOTE — Transfer of Care (Signed)
Immediate Anesthesia Transfer of Care Note  Patient: Donald Reid  Procedure(s) Performed: Procedure(s): URETEROSCOPY WITH HOLMIUM LASER LITHOTRIPSY (Left) CYSTOSCOPY WITH STENT REPLACEMENT (Left)  Patient Location: PACU  Anesthesia Type:General  Level of Consciousness: awake  Airway & Oxygen Therapy: Patient Spontanous Breathing  Post-op Assessment: Report given to RN  Post vital signs: Reviewed  Last Vitals:  Vitals:   08/22/15 1032 08/22/15 1148  BP: 105/62 113/81  Pulse: 79 79  Resp: 16 15  Temp: 36.6 C     Last Pain:  Vitals:   08/22/15 1032  TempSrc: Oral         Complications: No apparent anesthesia complications

## 2015-08-22 NOTE — H&P (View-Only) (Signed)
08/16/2015 11:49 AM   Vidal Schwalbe [birth year] [id number]  Referring provider: No referring provider defined for this encounter.  Chief Complaint  Patient presents with  . Follow-up    Epididymitis, right, Stent     HPI: The patient is a 30 year old male who recently underwent a left ureteral stent placement for a 4 mm stone in the setting of a urinary tract infection. He did develop a fever postoperatively so was admitted. He presents today to discuss definitive stone management.     PMH: Past Medical History  Diagnosis Date  . Cough LAST 2 DAYS    OCCASIONAL GREEN SPUTUM  . Kidney stone 6-7- YRS AGO    DID NOT PASS STONE  . Anxiety     Surgical History: Past Surgical History  Procedure Laterality Date  . Inguinal hernia repair  05/30/2011    Procedure: HERNIA REPAIR INGUINAL ADULT;  Surgeon: Emelia Loron, MD;  Location: [facility];  Service: General;  Laterality: Right;  . Cystoscopy w/ retrogrades Left 08/07/2015    Procedure: CYSTOSCOPY WITH RETROGRADE PYELOGRAM;  Surgeon: Crist Fat, MD;  Location: [facility];  Service: Urology;  Laterality: Left;  . Cystoscopy with stent placement Left 08/07/2015    Procedure: CYSTOSCOPY WITH STENT PLACEMENT;  Surgeon: Crist Fat, MD;  Location: [facility];  Service: Urology;  Laterality: Left;    Home Medications:    Medication List       This list is accurate as of: 08/16/15 11:49 AM.  Always use your most recent med list.               ciprofloxacin 500 MG tablet  Commonly known as:  CIPRO  Take 1 tablet (500 mg total) by mouth 2 (two) times daily.     docusate sodium 100 MG capsule  Commonly known as:  COLACE  Take 1 capsule (100 mg total) by mouth 2 (two) times daily.     HYDROmorphone 2 MG tablet  Commonly known as:  DILAUDID  Take 1 tablet (2 mg total) by mouth every 4 (four) hours as needed for moderate pain or severe pain.     ibuprofen 200 MG tablet  Commonly known as:  ADVIL,MOTRIN  Take  200-600 mg by mouth every 6 (six) hours as needed for fever, headache or mild pain. Reported on 08/16/2015     ketorolac 10 MG tablet  Commonly known as:  TORADOL  Take 1 tablet (10 mg total) by mouth every 6 (six) hours as needed for moderate pain.     ondansetron 4 MG disintegrating tablet  Commonly known as:  ZOFRAN ODT  Take 1 tablet (4 mg total) by mouth every 8 (eight) hours as needed for nausea or vomiting.     oxybutynin 5 MG tablet  Commonly known as:  DITROPAN  Take 1 tablet (5 mg total) by mouth every 8 (eight) hours as needed for bladder spasms.     oxyCODONE-acetaminophen 5-325 MG tablet  Commonly known as:  PERCOCET/ROXICET  Take 1 tablet by mouth every 4 (four) hours as needed for severe pain.     oxyCODONE-acetaminophen 5-325 MG tablet  Commonly known as:  PERCOCET  Take 1-2 tablets by mouth every 4 (four) hours as needed for moderate pain or severe pain.     promethazine 25 MG tablet  Commonly known as:  PHENERGAN  Take 1 tablet (25 mg total) by mouth every 6 (six) hours as needed for nausea or vomiting.     sulfamethoxazole-trimethoprim 800-160  MG tablet  Commonly known as:  BACTRIM DS,SEPTRA DS  Take 1 tablet by mouth 2 (two) times daily.     tamsulosin 0.4 MG Caps capsule  Commonly known as:  FLOMAX  Take 0.4 mg by mouth daily.        Allergies: No Known Allergies  Family History: Family History  Problem Relation Age of Onset  . Kidney disease Neg Hx   . Prostate cancer Neg Hx     Social History:  reports that he has quit smoking. His smoking use included Cigarettes. He has a 11 pack-year smoking history. He has never used smokeless tobacco. He reports that he uses illicit drugs (Marijuana). He reports that he does not drink alcohol.  ROS: UROLOGY Frequent Urination?: Yes Hard to postpone urination?: Yes Burning/pain with urination?: No Get up at night to urinate?: Yes Leakage of urine?: No Urine stream starts and stops?: No Trouble starting  stream?: No Do you have to strain to urinate?: No Blood in urine?: No Urinary tract infection?: No Sexually transmitted disease?: No Injury to kidneys or bladder?: No Painful intercourse?: No Weak stream?: No Erection problems?: No Penile pain?: No  Gastrointestinal Nausea?: No Vomiting?: No Indigestion/heartburn?: No Diarrhea?: No Constipation?: No  Constitutional Fever: No Night sweats?: No Weight loss?: No Fatigue?: No  Skin Skin rash/lesions?: No Itching?: No  Eyes Blurred vision?: No Double vision?: No  Ears/Nose/Throat Sore throat?: No  Hematologic/Lymphatic Swollen glands?: No Easy bruising?: No  Cardiovascular Leg swelling?: No Chest pain?: No  Respiratory Cough?: No Shortness of breath?: No  Endocrine Excessive thirst?: No  Musculoskeletal Back pain?: No Joint pain?: No  Neurological Headaches?: No Dizziness?: No  Psychologic Depression?: No Anxiety?: No  Physical Exam: BP 101/68 mmHg  Pulse 70  Ht 5\' 7"  (1.702 m)  Wt 125 lb 4.8 oz (56.836 kg)  BMI 19.62 kg/m2  Constitutional:  Alert and oriented, No acute distress. HEENT: [place] AT, moist mucus membranes.  Trachea midline, no masses. Cardiovascular: No clubbing, cyanosis, or edema. Respiratory: Normal respiratory effort, no increased work of breathing. GI: Abdomen is soft, nontender, nondistended, no abdominal masses GU: No CVA tenderness. Skin: No rashes, bruises or suspicious lesions. Lymph: No cervical or inguinal adenopathy. Neurologic: Grossly intact, no focal deficits, moving all 4 extremities. Psychiatric: Normal mood and affect.  Laboratory Data: Lab Results  Component Value Date   WBC 7.9 07/28/2015   HGB 12.8* 07/28/2015   HCT 36.5* 07/28/2015   MCV 90.8 07/28/2015   PLT 167 07/28/2015    Lab Results  Component Value Date   CREATININE 0.89 07/28/2015    No results found for: PSA  No results found for: TESTOSTERONE  No results found for:  HGBA1C  Urinalysis    Component Value Date/Time   COLORURINE YELLOW* 08/13/2015 1754   APPEARANCEUR HAZY* 08/13/2015 1754   APPEARANCEUR Cloudy* 08/07/2015 0922   LABSPEC 1.014 08/13/2015 1754   PHURINE 8.0 08/13/2015 1754   GLUCOSEU NEGATIVE 08/13/2015 1754   HGBUR 3+* 08/13/2015 1754   BILIRUBINUR NEGATIVE 08/13/2015 1754   BILIRUBINUR Negative 08/07/2015 0922   KETONESUR NEGATIVE 08/13/2015 1754   PROTEINUR 30* 08/13/2015 1754   PROTEINUR 3+* 08/07/2015 0922   NITRITE NEGATIVE 08/13/2015 1754   NITRITE Positive* 08/07/2015 0922   LEUKOCYTESUR 1+* 08/13/2015 1754   LEUKOCYTESUR 1+* 08/07/2015 0922     Assessment & Plan:    1. Left ureteral stone I discussed the risks, benefits, indications of left ureteroscopy with laser lithotripsy to remove his stone. We discussed  the risks include but to bleeding, infection, iatrogenic injury, need for repeat procedures. All questions were answered. The patient has elected to undergo cystoscopy, left ureteroscopy, laser lithotripsy, left ureteral stent exchange.   Hildred Laser, MD  [facility] [address], [address] [place], [place] [address] [phone]

## 2015-08-22 NOTE — Op Note (Signed)
Date of procedure: 08/22/15  Preoperative diagnosis:  1. Left ureteral stone   Postoperative diagnosis:  1. Left ureteral stone   Procedure: 1. Cystoscopy 2. Left ureteroscopy 3. Left laser lithotripsy 4. Stone basketing 5. Left ureteral stent exchange 6 French by 26 cm with tether 6. Left retrograde pyelogram with interpretation  Surgeon: Baruch Gouty, MD  Anesthesia: General  Complications: None  Intraoperative findings: The patient had a 5 Milner stone in the distal ureter that was broken and a small fragment. Pan ureteroscopy in the left as well as the left retropyelogram showed no filling defects consistent with no further stone burden.  EBL: None  Specimens: Left ureteral stone  Drains: 6 French by 2670 double-J ureteral stent with tether  Disposition: Stable to the postanesthesia care unit  Indication for procedure: The patient is a 30 y.o. male with history of sepsis secondary to an obstructing left renal stone status post stent placement. He presents today for definitive surgical management.  After reviewing the management options for treatment, the patient elected to proceed with the above surgical procedure(s). We have discussed the potential benefits and risks of the procedure, side effects of the proposed treatment, the likelihood of the patient achieving the goals of the procedure, and any potential problems that might occur during the procedure or recuperation. Informed consent has been obtained.  Description of procedure: The patient was met in the preoperative area. All risks, benefits, and indications of the procedure were described in great detail. The patient consented to the procedure. Preoperative antibiotics were given. The patient was taken to the operative theater. General anesthesia was induced per the anesthesia service. The patient was then placed in the dorsal lithotomy position and prepped and draped in the usual sterile fashion. A preoperative  timeout was called.   A 21 French 30 cystoscope was inserted in the patient's bladder atraumatically per urethra. The left ureteral stent was grasped flexible graspers and brought to level the urethral meatus. A sensor wire was exchanged through to the level of renal pelvis stent was removed. A semirigid ureters was inserted in the patient's bladder per urethra than the left ureteral orifice was intubated. In the distal ureter the stone was encountered. Broken into small fragments with laser lithotripsy. The stone drains removed evacuated from the bladder. Pan ureteroscopy showed no further stone burden. A left retrograde pyelogram showed no further filling defects consistent with any further stone burden. The cystoscope was then inserted over the remaining sensor wire. 6 [place] by 26 cm double-J ureteral stent was then placed with tether. Was confirmed in the correct location with fluoroscopy in the renal pelvis and a curl urinary bladder. The patient's bladder was drained. After cystoscope was removed. Fluoroscopy then again confirmed to curl into both in the bladder and in the left renal pelvis. The stent was in the correct position. The stent was in tethered to the patient's penis. His most anesthesia and transferred stable condition to the postanesthesia care unit.  Plan: The patient will pole the stent in 4 days by pulling on the tether. He'll follow-up in one month with a renal ultrasound prior. We will go over the stone analysis at this time. He does have a history of 2 stones in his left  Baruch Gouty, M.D.

## 2015-08-22 NOTE — Anesthesia Preprocedure Evaluation (Signed)
Anesthesia Evaluation  Patient identified by MRN, date of birth, ID band Patient awake    Reviewed: Allergy & Precautions, NPO status , Patient's Chart, lab work & pertinent test results  History of Anesthesia Complications Negative for: history of anesthetic complications  Airway Mallampati: II  TM Distance: >3 FB Neck ROM: Full    Dental no notable dental hx.    Pulmonary neg COPD, Current Smoker,    breath sounds clear to auscultation- rhonchi (-) wheezing  (-) intubated    Cardiovascular Exercise Tolerance: Good (-) hypertension(-) angina(-) CAD and (-) Past MI  Rhythm:Regular Rate:Normal - Systolic murmurs and - Diastolic murmurs    Neuro/Psych Anxiety    GI/Hepatic negative GI ROS, Neg liver ROS,   Endo/Other  negative endocrine ROSneg diabetes  Renal/GU Renal disease (nephrolithiasis)     Musculoskeletal negative musculoskeletal ROS (+)   Abdominal (+) - obese,   Peds  Hematology negative hematology ROS (+)   Anesthesia Other Findings   Reproductive/Obstetrics                             Anesthesia Physical Anesthesia Plan  ASA: II  Anesthesia Plan: General   Post-op Pain Management:    Induction: Intravenous  Airway Management Planned: Oral ETT  Additional Equipment:   Intra-op Plan:   Post-operative Plan: Extubation in OR  Informed Consent: I have reviewed the patients History and Physical, chart, labs and discussed the procedure including the risks, benefits and alternatives for the proposed anesthesia with the patient or authorized representative who has indicated his/her understanding and acceptance.   Dental advisory given  Plan Discussed with: Anesthesiologist and CRNA  Anesthesia Plan Comments:         Anesthesia Quick Evaluation

## 2015-08-23 ENCOUNTER — Telehealth: Payer: Self-pay | Admitting: Urology

## 2015-08-23 ENCOUNTER — Encounter: Payer: Self-pay | Admitting: Urology

## 2015-08-23 ENCOUNTER — Telehealth: Payer: Self-pay

## 2015-08-23 DIAGNOSIS — N200 Calculus of kidney: Secondary | ICD-10-CM

## 2015-08-23 NOTE — Telephone Encounter (Signed)
Orders placed.

## 2015-08-23 NOTE — Telephone Encounter (Signed)
Pt called stating he had surgery yesterday and a stent was placed. Pt c/o of burning on urination and bladder contractions. Reinforced with pt dysuria and bladder spasms are both due to the stent being in place. Reinforced with pt to take post surgery medications, flomax and oxybutynin. Pt voiced understanding of whole conversation.

## 2015-08-23 NOTE — Telephone Encounter (Signed)
-----   Message from Skeet Latch, LPN sent at 7/86/7672  1:32 PM EDT -----   ----- Message ----- From: Hildred Laser, MD Sent: 08/22/2015  11:39 AM To: Skeet Latch, LPN  Patient needs to see me in a month with renal u/s prior.    It does not seem that I can task [facility] anymore with epic updates.  thanks

## 2015-08-23 NOTE — Telephone Encounter (Signed)
Can you put in an order for the RUS   Thanks,  Marcelino Duster

## 2015-08-26 ENCOUNTER — Other Ambulatory Visit: Payer: Self-pay | Admitting: Urology

## 2015-09-04 LAB — STONE ANALYSIS
CA OXALATE, DIHYDRATE: 45 %
CA OXALATE, MONOHYDR.: 30 %
CA PHOS CRY STONE QL IR: 25 %
Stone Weight KSTONE: 17 mg

## 2015-09-17 ENCOUNTER — Ambulatory Visit: Payer: Self-pay

## 2015-09-21 ENCOUNTER — Ambulatory Visit: Payer: Self-pay | Admitting: Urology

## 2015-09-21 ENCOUNTER — Ambulatory Visit
Admission: RE | Admit: 2015-09-21 | Discharge: 2015-09-21 | Disposition: A | Discharge status: Home / Self Care | Payer: Self-pay | Source: Ambulatory Visit | Attending: Urology | Admitting: Urology

## 2015-09-21 DIAGNOSIS — Z87442 Personal history of urinary calculi: Secondary | ICD-10-CM | POA: Insufficient documentation

## 2015-09-21 DIAGNOSIS — N200 Calculus of kidney: Secondary | ICD-10-CM

## 2015-09-21 DIAGNOSIS — Z09 Encounter for follow-up examination after completed treatment for conditions other than malignant neoplasm: Secondary | ICD-10-CM | POA: Insufficient documentation

## 2015-09-27 ENCOUNTER — Encounter: Payer: Self-pay | Admitting: Urology

## 2015-09-27 ENCOUNTER — Ambulatory Visit (INDEPENDENT_AMBULATORY_CARE_PROVIDER_SITE_OTHER): Payer: Self-pay | Admitting: Urology

## 2015-09-27 VITALS — BP 116/73 | HR 96 | Ht 67.0 in | Wt 134.0 lb

## 2015-09-27 DIAGNOSIS — N200 Calculus of kidney: Secondary | ICD-10-CM

## 2015-09-27 NOTE — Progress Notes (Signed)
09/27/2015 4:20 PM   Donald Reid 09-16-1985 [id number]  Referring provider: No referring provider defined for this encounter.  Chief Complaint  Patient presents with  . Nephrolithiasis    HPI: The patient is a 30 year old male presents for follow-up after undergoing left ureteroscopy and stone removal. This was a staged procedure as he initially was temporized the stent due to sepsis. He removed his stent on his own at home. She follows up today with a normal post instrumentation renal ultrasound. Stone composition was 45% calcium oxalate dihydrate, 30% calcium oxalate monohydrate, and 25% calcium phosphate.   PMH: Past Medical History:  Diagnosis Date  . Anxiety   . Cough LAST 2 DAYS   OCCASIONAL GREEN SPUTUM  . Kidney stone 6-7- YRS AGO   DID NOT PASS STONE    Surgical History: Past Surgical History:  Procedure Laterality Date  . CYSTOSCOPY W/ RETROGRADES Left 08/07/2015   Procedure: CYSTOSCOPY WITH RETROGRADE PYELOGRAM;  Surgeon: Crist Fat, MD;  Location: [facility];  Service: Urology;  Laterality: Left;  . CYSTOSCOPY W/ URETERAL STENT PLACEMENT Left 08/22/2015   Procedure: CYSTOSCOPY WITH STENT REPLACEMENT;  Surgeon: Hildred Laser, MD;  Location: [facility];  Service: Urology;  Laterality: Left;  . CYSTOSCOPY WITH STENT PLACEMENT Left 08/07/2015   Procedure: CYSTOSCOPY WITH STENT PLACEMENT;  Surgeon: Crist Fat, MD;  Location: [facility];  Service: Urology;  Laterality: Left;  . INGUINAL HERNIA REPAIR  05/30/2011   Procedure: HERNIA REPAIR INGUINAL ADULT;  Surgeon: Emelia Loron, MD;  Location: [facility];  Service: General;  Laterality: Right;  . URETEROSCOPY WITH HOLMIUM LASER LITHOTRIPSY Left 08/22/2015   Procedure: URETEROSCOPY WITH HOLMIUM LASER LITHOTRIPSY;  Surgeon: Hildred Laser, MD;  Location: [facility];  Service: Urology;  Laterality: Left;    Home Medications:    Medication List       Accurate as of 09/27/15  4:20 PM. Always use your  most recent med list.          ibuprofen 200 MG tablet Commonly known as:  ADVIL,MOTRIN Take 200-600 mg by mouth every 6 (six) hours as needed for fever, headache or mild pain. Reported on 08/16/2015       Allergies: No Known Allergies  Family History: Family History  Problem Relation Age of Onset  . Kidney disease Neg Hx   . Prostate cancer Neg Hx     Social History:  reports that he has quit smoking. His smoking use included Cigarettes. He has a 11.00 pack-year smoking history. He has never used smokeless tobacco. He reports that he uses drugs, including Marijuana. He reports that he does not drink alcohol.  ROS: UROLOGY Frequent Urination?: No Hard to postpone urination?: No Burning/pain with urination?: No Get up at night to urinate?: No Leakage of urine?: No Urine stream starts and stops?: No Trouble starting stream?: No Do you have to strain to urinate?: No Blood in urine?: No Urinary tract infection?: No Sexually transmitted disease?: No Injury to kidneys or bladder?: No Painful intercourse?: No Weak stream?: No Erection problems?: No Penile pain?: No  Gastrointestinal Nausea?: No Vomiting?: No Indigestion/heartburn?: No Diarrhea?: No Constipation?: No  Constitutional Fever: No Night sweats?: No Weight loss?: No Fatigue?: No  Skin Skin rash/lesions?: No Itching?: No  Eyes Blurred vision?: No Double vision?: No  Ears/Nose/Throat Sore throat?: No Sinus problems?: No  Hematologic/Lymphatic Swollen glands?: No Easy bruising?: No  Cardiovascular Leg swelling?: No Chest pain?: No  Respiratory Cough?: No Shortness of breath?: No  Endocrine Excessive thirst?: No  Musculoskeletal Back pain?: No Joint pain?: No  Neurological Headaches?: No Dizziness?: No  Psychologic Depression?: No Anxiety?: No  Physical Exam: BP 116/73   Pulse 96   Ht 5\' 7"  (1.702 m)   Wt 134 lb (60.8 kg)   BMI 20.99 kg/m   Constitutional:  Alert and  oriented, No acute distress. HEENT: [place] AT, moist mucus membranes.  Trachea midline, no masses. Cardiovascular: No clubbing, cyanosis, or edema. Respiratory: Normal respiratory effort, no increased work of breathing. GI: Abdomen is soft, nontender, nondistended, no abdominal masses GU: No CVA tenderness.  Skin: No rashes, bruises or suspicious lesions. Lymph: No cervical or inguinal adenopathy. Neurologic: Grossly intact, no focal deficits, moving all 4 extremities. Psychiatric: Normal mood and affect.  Laboratory Data: Lab Results  Component Value Date   WBC 7.9 07/28/2015   HGB 12.8 (L) 07/28/2015   HCT 36.5 (L) 07/28/2015   MCV 90.8 07/28/2015   PLT 167 07/28/2015    Lab Results  Component Value Date   CREATININE 0.89 07/28/2015    No results found for: PSA  No results found for: TESTOSTERONE  No results found for: HGBA1C  Urinalysis    Component Value Date/Time   COLORURINE YELLOW (A) 08/13/2015 1754   APPEARANCEUR HAZY (A) 08/13/2015 1754   APPEARANCEUR Cloudy (A) 08/07/2015 0922   LABSPEC 1.014 08/13/2015 1754   PHURINE 8.0 08/13/2015 1754   GLUCOSEU NEGATIVE 08/13/2015 1754   HGBUR 3+ (A) 08/13/2015 1754   BILIRUBINUR NEGATIVE 08/13/2015 1754   BILIRUBINUR Negative 08/07/2015 0922   KETONESUR NEGATIVE 08/13/2015 1754   PROTEINUR 30 (A) 08/13/2015 1754   NITRITE NEGATIVE 08/13/2015 1754   LEUKOCYTESUR 1+ (A) 08/13/2015 1754   LEUKOCYTESUR 1+ (A) 08/07/2015 [id number]    Pertinent Imaging: Renal u/s reviewed. No hydronephrosis.  Assessment & Plan:    1. History of left ureteral stone Normal post instrumentation renal ultrasound. Stone analysis discussed with patient. He was given information regarding stone prevention in the future. We discussed ABCs of stones booklet. He will ollow-up when necessary.   Hildred LaserBrian James Inella Kuwahara, MD  [facility] [address], [address] [place], [place] [id number] [phone]

## 2018-03-08 IMAGING — CT CT RENAL STONE PROTOCOL
3 of 4 series · 9 of 46 positions shown, 16 images · non-contrast
Comparison: None

CLINICAL DATA: Left-sided back and flank pain for 30 minutes.

EXAM:
CT ABDOMEN AND PELVIS WITHOUT CONTRAST
TECHNIQUE: Multidetector CT imaging of the abdomen and pelvis was performed
following the standard protocol without IV contrast.

[Series 4: lung · axial · 0.65mm/px · z∈[-140,-60]mm · 5 of 24 slices shown, 10 images]
[im 4/24  soft-tissue]
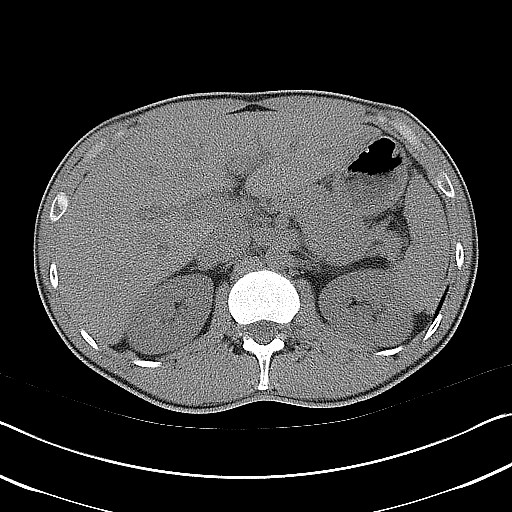
[im 4/24  bone]
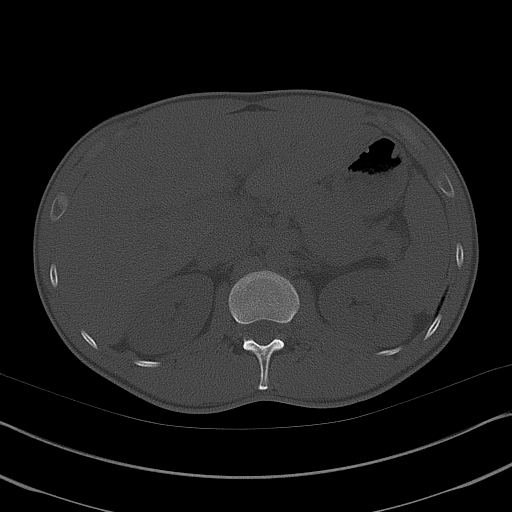
[im 8/24  soft-tissue]
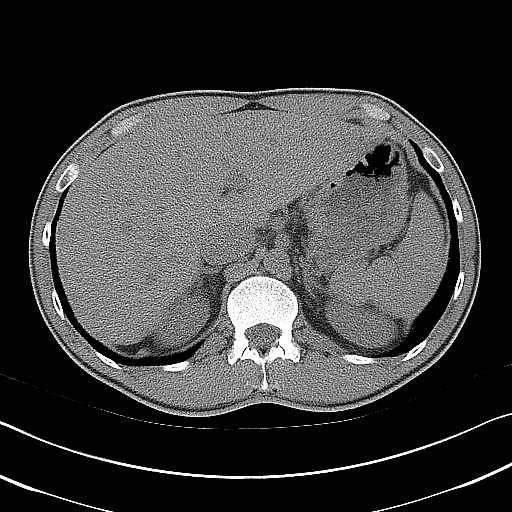
[im 8/24  lung]
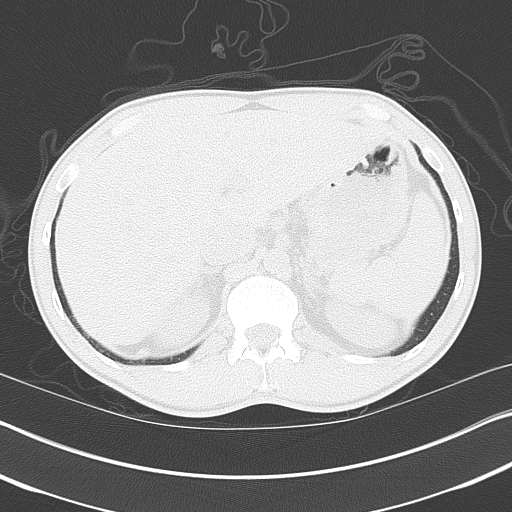
[im 12/24  soft-tissue]
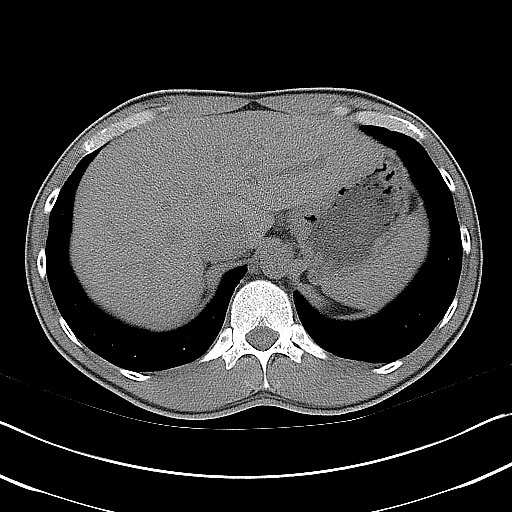
[im 12/24  lung]
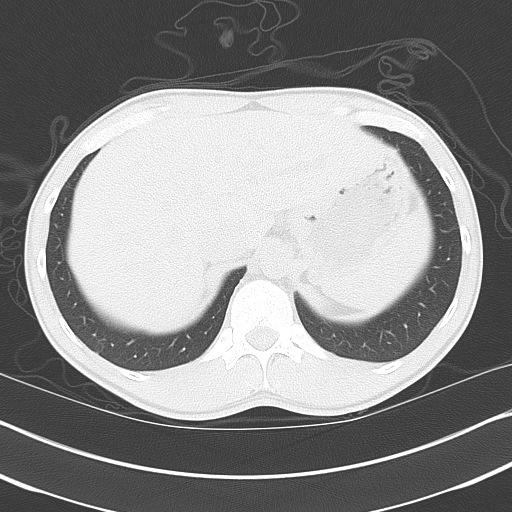
[im 16/24  soft-tissue]
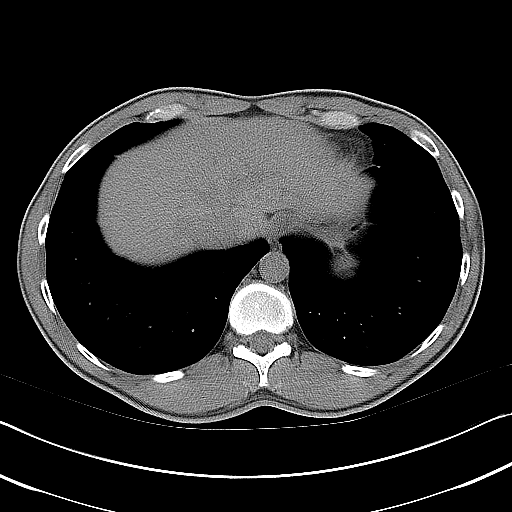
[im 16/24  lung]
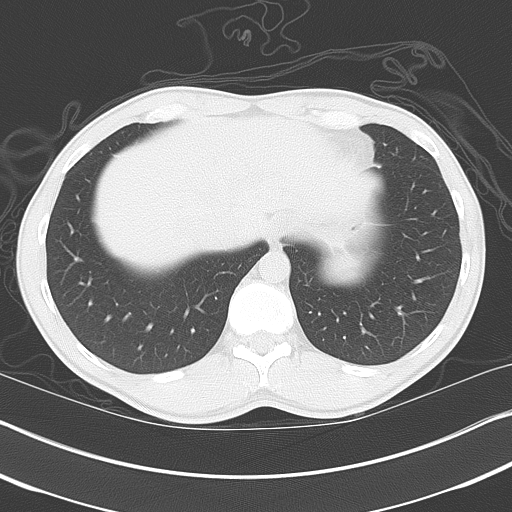
[im 20/24  soft-tissue]
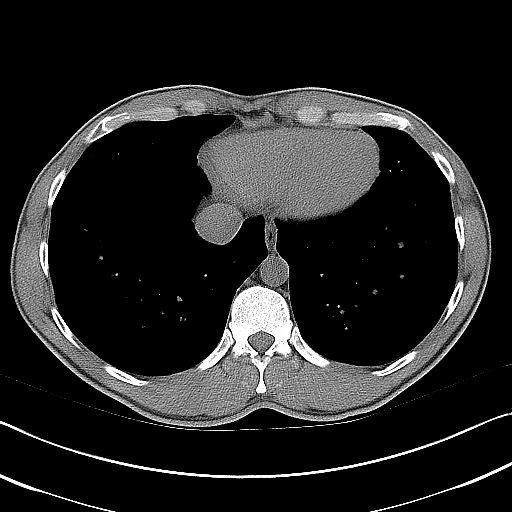
[im 20/24  lung]
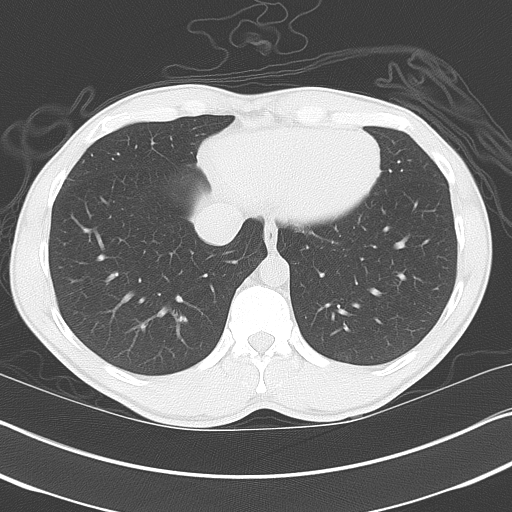

[Series 5: coronal · coronal · 0.88mm/px · 3 of 114 slices shown, 4 images]
[im 38/114  soft-tissue]
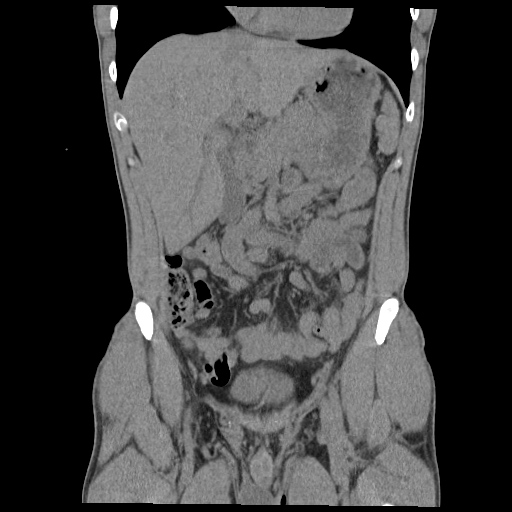
[im 51/114  soft-tissue]
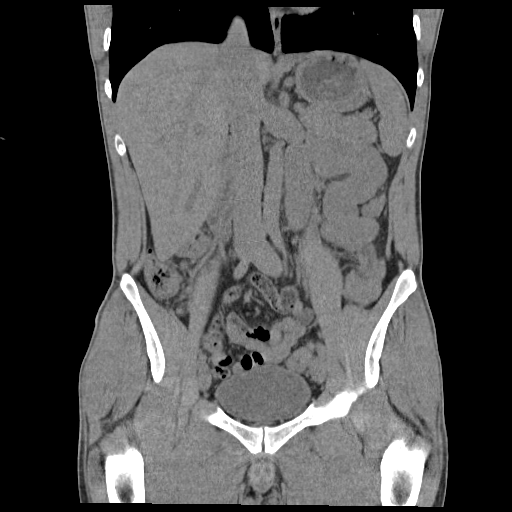
[im 51/114  bone]
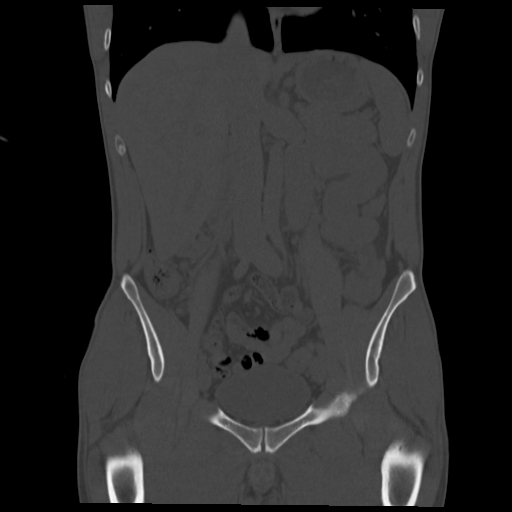
[im 63/114  soft-tissue]
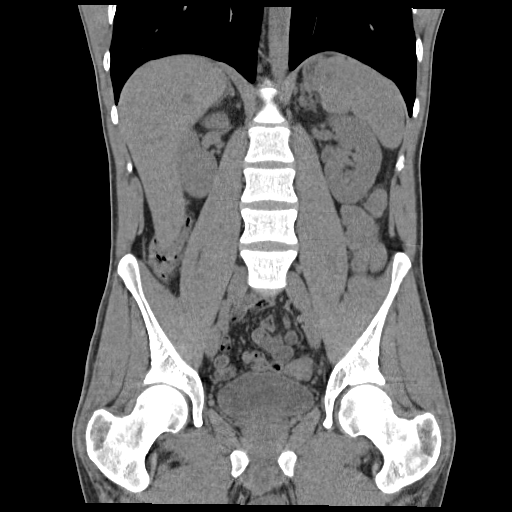

[Series 6: sagittal · sagittal · 0.92mm/px · 1 of 162 slices shown, 2 images]
[im 54/162  soft-tissue]
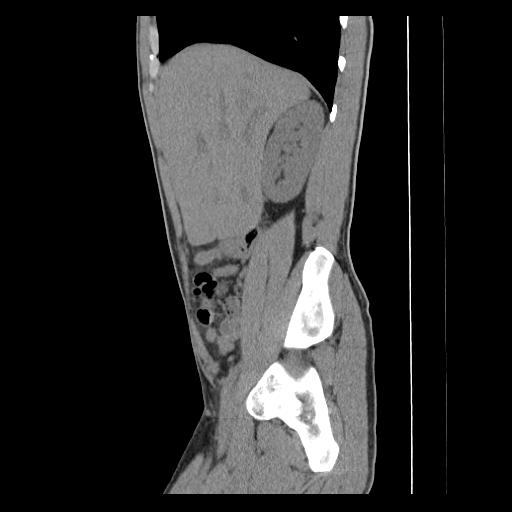
[im 54/162  bone]
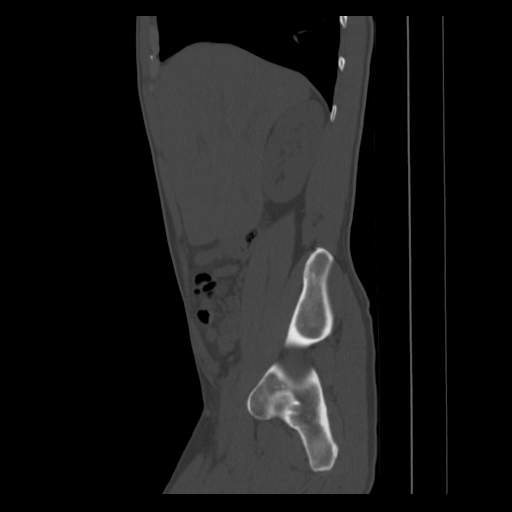

[9 of 46 positions shown; findings below may reference images not displayed]

FINDINGS: There is a 4 x 4 x 5 mm obstructing calculus of the proximal left
ureter at the upper L3 level. Moderate hydronephrosis. No other
urinary calculi are evident. No other acute findings are evident.

There are unremarkable unenhanced appearances of the liver, spleen,
pancreas, adrenals, gallbladder and bile ducts.

The abdominal aorta is normal in caliber. There is no
atherosclerotic calcification. There is no adenopathy in the abdomen
or pelvis.

There are normal appearances of the stomach, small bowel and colon.
The appendix is normal.

No significant abnormality in the lower chest. No significant
skeletal lesion.
IMPRESSION: Obstructing 4 x 4 x 5 mm proximal left ureteral calculus at the L3
level. Moderate hydronephrosis.

## 2020-03-22 LAB — LIPID PANEL
Cholesterol: 254 — AB (ref 0–200)
HDL: 42 (ref 35–70)
LDL Cholesterol: 178
Triglycerides: 181 — AB (ref 40–160)

## 2020-03-22 LAB — BASIC METABOLIC PANEL
BUN: 13 (ref 4–21)
BUN: 13 (ref 4–21)
Creatinine: 1 (ref 0.6–1.3)

## 2020-07-02 ENCOUNTER — Encounter: Payer: Self-pay | Admitting: Internal Medicine

## 2020-07-02 ENCOUNTER — Ambulatory Visit (INDEPENDENT_AMBULATORY_CARE_PROVIDER_SITE_OTHER): Payer: 59 | Admitting: Internal Medicine

## 2020-07-02 ENCOUNTER — Other Ambulatory Visit: Payer: Self-pay

## 2020-07-02 VITALS — BP 120/78 | HR 98 | Temp 98.5°F | Ht 67.0 in | Wt 149.0 lb

## 2020-07-02 DIAGNOSIS — K4090 Unilateral inguinal hernia, without obstruction or gangrene, not specified as recurrent: Secondary | ICD-10-CM | POA: Diagnosis not present

## 2020-07-02 NOTE — Progress Notes (Signed)
Subjective:  Patient ID: Donald Reid, male    DOB: [birth year]  Age: 35 y.o. MRN: [id number]  CC: New Patient (Initial Visit)  This visit occurred during the SARS-CoV-2 public health emergency.  Safety protocols were in place, including screening questions prior to the visit, additional usage of staff PPE, and extensive cleaning of exam room while observing appropriate contact time as indicated for disinfecting solutions.    HPI Donald Reid presents for f/up and to establish.  Donald Reid complains of a 3-week history of discomfort in his left groin that Donald Reid describes as a popping sensation.  Donald Reid had right hernia repair 7 years ago.  History Donald Reid has a past medical history of Anxiety, Cough (LAST 2 DAYS), and Kidney stone.   Donald Reid has a past surgical history that includes Inguinal hernia repair (05/30/2011); Cystoscopy w/ retrogrades (Left, 08/07/2015); Cystoscopy with stent placement (Left, 08/07/2015); Ureteroscopy with holmium laser lithotripsy (Left, 08/22/2015); and Cystoscopy w/ ureteral stent placement (Left, 08/22/2015).   His family history is not on file.Donald Reid reports that Donald Reid has been smoking cigarettes. Donald Reid has a 12.00 pack-year smoking history. Donald Reid has never used smokeless tobacco. Donald Reid reports current drug use. Drug: Marijuana. Donald Reid reports that Donald Reid does not drink alcohol.  Outpatient Medications Prior to Visit  Medication Sig Dispense Refill  . ibuprofen (ADVIL,MOTRIN) 200 MG tablet Take 200-600 mg by mouth every 6 (six) hours as needed for fever, headache or mild pain. Reported on 08/16/2015     No facility-administered medications prior to visit.    ROS Review of Systems  Constitutional: Negative for chills, diaphoresis, fatigue and fever.  HENT: Negative.   Eyes: Negative.   Respiratory: Negative for cough, chest tightness, shortness of breath and wheezing.   Cardiovascular: Negative for chest pain, palpitations and leg swelling.  Gastrointestinal: Negative for abdominal pain,  constipation, diarrhea, nausea and vomiting.  Endocrine: Negative.   Genitourinary: Negative.  Negative for difficulty urinating, dysuria, hematuria, penile swelling, scrotal swelling and testicular pain.  Musculoskeletal: Negative for arthralgias and myalgias.  Skin: Negative.   Neurological: Negative.  Negative for dizziness, weakness and light-headedness.  Hematological: Negative.   Psychiatric/Behavioral: Negative.     Objective:  BP 120/78 (BP Location: Right Arm, Patient Position: Sitting, Cuff Size: Large)   Pulse 98   Temp 98.5 F (36.9 C) (Oral)   Ht 5\' 7"  (1.702 m)   Wt 149 lb (67.6 kg)   SpO2 96%   BMI 23.34 kg/m   Physical Exam Vitals reviewed.  Constitutional:      Appearance: Normal appearance.  HENT:     Nose: Nose normal.     Mouth/Throat:     Mouth: Mucous membranes are moist.  Eyes:     General: No scleral icterus.    Conjunctiva/sclera: Conjunctivae normal.  Cardiovascular:     Rate and Rhythm: Normal rate and regular rhythm.     Heart sounds: No murmur heard.   Pulmonary:     Effort: Pulmonary effort is normal.     Breath sounds: No stridor. No wheezing, rhonchi or rales.  Abdominal:     General: Abdomen is flat. Bowel sounds are normal. There is no distension.     Palpations: Abdomen is soft. There is no hepatomegaly, splenomegaly or mass.     Tenderness: There is no abdominal tenderness.     Hernia: A hernia is present. Hernia is present in the left inguinal area. There is no hernia in the right inguinal area.  Genitourinary:    Pubic  Area: No rash.      Penis: Normal and circumcised.      Testes: Normal.     Epididymis:     Right: Normal.     Left: Normal.     Comments: Small, direct, left inguinal hernia that reduces spontaneously. Musculoskeletal:        General: Normal range of motion.     Cervical back: Neck supple.     Right lower leg: No edema.  Lymphadenopathy:     Cervical: No cervical adenopathy.     Lower Body: No right  inguinal adenopathy. No left inguinal adenopathy.  Neurological:     Mental Status: Donald Reid is alert.     Lab Results  Component Value Date   WBC 7.9 07/28/2015   HGB 12.8 (L) 07/28/2015   HCT 36.5 (L) 07/28/2015   PLT 167 07/28/2015   GLUCOSE 98 07/28/2015   CHOL 254 (A) 03/22/2020   TRIG 181 (A) 03/22/2020   HDL 42 03/22/2020   LDLCALC 178 03/22/2020   ALT 40 07/27/2015   AST 38 07/27/2015   NA 139 07/28/2015   K 3.4 (L) 07/28/2015   CL 110 07/28/2015   CREATININE 1.0 03/22/2020   BUN 13 03/22/2020   CO2 24 07/28/2015    Assessment & Plan:   Donald Reid was seen today for new patient (initial visit).  Diagnoses and all orders for this visit:  Direct left inguinal hernia -     Ambulatory referral to General Surgery   I am having Donald Reid maintain his ibuprofen.  No orders of the defined types were placed in this encounter.    Follow-up: Return if symptoms worsen or fail to improve.  Sanda Linger, MD

## 2020-07-02 NOTE — Patient Instructions (Signed)
Inguinal Hernia, Adult An inguinal hernia develops when fat or the intestines push through a weak spot in a muscle where the leg meets the lower abdomen (groin). This creates a bulge. This kind of hernia could also be:  In the scrotum, if you are male.  In folds of skin around the vagina, if you are male. There are three types of inguinal hernias:  Hernias that can be pushed back into the abdomen (are reducible). This type rarely causes pain.  Hernias that are not reducible (are incarcerated).  Hernias that are not reducible and lose their blood supply (are strangulated). This type of hernia requires emergency surgery. What are the causes? This condition is caused by having a weak spot in the muscles or tissues in your groin. This develops over time. The hernia may poke through the weak spot when you suddenly strain your lower abdominal muscles, such as when you:  Lift a heavy object.  Strain to have a bowel movement. Constipation can lead to straining.  Cough. What increases the risk? This condition is more likely to develop in:  Males.  Pregnant females.  People who: ? Are overweight. ? Work in jobs that require long periods of standing or heavy lifting. ? Have had an inguinal hernia before. ? Smoke or have lung disease. These factors can lead to long-term (chronic) coughing. What are the signs or symptoms? Symptoms may depend on the size of the hernia. Often, a small inguinal hernia has no symptoms. Symptoms of a larger hernia may include:  A bulge in the groin area. This is easier to see when standing. It might not be visible when lying down.  Pain or burning in the groin. This may get worse when lifting, straining, or coughing.  A dull ache or a feeling of pressure in the groin.  An unusual bulge in the scrotum, in males. Symptoms of a strangulated inguinal hernia may include:  A bulge in your groin that is very painful and tender to the touch.  A bulge that  turns red or purple.  Fever, nausea, and vomiting.  Inability to have a bowel movement or to pass gas. How is this diagnosed? This condition is diagnosed based on your symptoms, your medical history, and a physical exam. Your health care provider may feel your groin area and ask you to cough. How is this treated? Treatment depends on the size of your hernia and whether you have symptoms. If you do not have symptoms, your health care provider may have you watch your hernia carefully and have you come in for follow-up visits. If your hernia is large or if you have symptoms, you may need surgery to repair the hernia. Follow these instructions at home: Lifestyle  Avoid lifting heavy objects.  Avoid standing for long periods of time.  Do not use any products that contain nicotine or tobacco. These products include cigarettes, chewing tobacco, and vaping devices, such as e-cigarettes. If you need help quitting, ask your health care provider.  Maintain a healthy weight. Preventing constipation You may need to take these actions to prevent or treat constipation:  Drink enough fluid to keep your urine pale yellow.  Take over-the-counter or prescription medicines.  Eat foods that are high in fiber, such as beans, whole grains, and fresh fruits and vegetables.  Limit foods that are high in fat and processed sugars, such as fried or sweet foods. General instructions  You may try to push the hernia back in place by very gently   pressing on it while lying down. Do not try to force the bulge back in if it will not push in easily.  Watch your hernia for any changes in shape, size, or color. Get help right away if you notice any changes.  Take over-the-counter and prescription medicines only as told by your health care provider.  Keep all follow-up visits. This is important. Contact a health care provider if:  You have a fever or chills.  You develop new symptoms.  Your symptoms get  worse. Get help right away if:  You have pain in your groin that suddenly gets worse.  You have a bulge in your groin that: ? Suddenly gets bigger and does not get smaller. ? Becomes red or purple or painful to the touch.  You are a man and you have a sudden pain in your scrotum, or the size of your scrotum suddenly changes.  You cannot push the hernia back in place by very gently pressing on it when you are lying down.  You have nausea or vomiting that does not go away.  You have a fast heartbeat.  You cannot have a bowel movement or pass gas. These symptoms may represent a serious problem that is an emergency. Do not wait to see if the symptoms will go away. Get medical help right away. Call your local emergency services (911 in the [place] Summary  An inguinal hernia develops when fat or the intestines push through a weak spot in a muscle where your leg meets your lower abdomen (groin).  This condition is caused by having a weak spot in muscles or tissues in your groin.  Symptoms may depend on the size of the hernia, and they may include pain or swelling in your groin. A small inguinal hernia often has no symptoms.  Treatment may not be needed if you do not have symptoms. If you have symptoms or a large hernia, you may need surgery to repair the hernia.  Avoid lifting heavy objects. Also, avoid standing for long periods of time. This information is not intended to replace advice given to you by your health care provider. Make sure you discuss any questions you have with your health care provider. Document Revised: 09/13/2019 Document Reviewed: 09/13/2019 Elsevier Patient Education  2021 [employer].  

## 2020-10-02 DIAGNOSIS — Z720 Tobacco use: Secondary | ICD-10-CM | POA: Insufficient documentation

## 2020-12-03 ENCOUNTER — Other Ambulatory Visit: Payer: Self-pay

## 2020-12-03 ENCOUNTER — Encounter: Payer: Self-pay | Admitting: Internal Medicine

## 2020-12-03 ENCOUNTER — Ambulatory Visit (INDEPENDENT_AMBULATORY_CARE_PROVIDER_SITE_OTHER): Payer: 59 | Admitting: Internal Medicine

## 2020-12-03 VITALS — BP 126/84 | HR 84 | Temp 98.5°F | Ht 67.0 in | Wt 153.0 lb

## 2020-12-03 DIAGNOSIS — Z0001 Encounter for general adult medical examination with abnormal findings: Secondary | ICD-10-CM | POA: Diagnosis not present

## 2020-12-03 DIAGNOSIS — E781 Pure hyperglyceridemia: Secondary | ICD-10-CM | POA: Diagnosis not present

## 2020-12-03 DIAGNOSIS — Z1159 Encounter for screening for other viral diseases: Secondary | ICD-10-CM | POA: Insufficient documentation

## 2020-12-03 DIAGNOSIS — L739 Follicular disorder, unspecified: Secondary | ICD-10-CM | POA: Diagnosis not present

## 2020-12-03 DIAGNOSIS — E785 Hyperlipidemia, unspecified: Secondary | ICD-10-CM

## 2020-12-03 LAB — HEPATIC FUNCTION PANEL
ALT: 24 U/L (ref 0–53)
AST: 21 U/L (ref 0–37)
Albumin: 4.6 g/dL (ref 3.5–5.2)
Alkaline Phosphatase: 71 U/L (ref 39–117)
Bilirubin, Direct: 0 mg/dL (ref 0.0–0.3)
Total Bilirubin: 0.3 mg/dL (ref 0.2–1.2)
Total Protein: 7.3 g/dL (ref 6.0–8.3)

## 2020-12-03 LAB — LIPID PANEL
Cholesterol: 266 mg/dL — ABNORMAL HIGH (ref 0–200)
HDL: 37.6 mg/dL — ABNORMAL LOW (ref >39.00)
Total CHOL/HDL Ratio: 7
Triglycerides: 555 mg/dL — ABNORMAL HIGH (ref 0.0–149.0)

## 2020-12-03 LAB — TSH: TSH: 2.11 u[IU]/mL (ref 0.35–5.50)

## 2020-12-03 LAB — LDL CHOLESTEROL, DIRECT: Direct LDL: 92 mg/dL

## 2020-12-03 MED ORDER — DOXYCYCLINE HYCLATE 100 MG PO TABS: 100.0000 mg | ORAL_TABLET | Freq: Two times a day (BID) | ORAL | 0 refills | Status: AC

## 2020-12-03 NOTE — Progress Notes (Signed)
Subjective:  Patient ID: Donald Reid, male    DOB: May 30, 1985  Age: 35 y.o. MRN: [id number]  CC: Rash  This visit occurred during the SARS-CoV-2 public health emergency.  Safety protocols were in place, including screening questions prior to the visit, additional usage of staff PPE, and extensive cleaning of exam room while observing appropriate contact time as indicated for disinfecting solutions.    HPI Donald Reid presents for f/up-  He complains of a several week hx of red rash in his right groin. He refused a flu vax today.  Outpatient Medications Prior to Visit  Medication Sig Dispense Refill   ibuprofen (ADVIL,MOTRIN) 200 MG tablet Take 200-600 mg by mouth every 6 (six) hours as needed for fever, headache or mild pain. Reported on 08/16/2015     No facility-administered medications prior to visit.    ROS Review of Systems  Constitutional: Negative.  Negative for chills, diaphoresis and fatigue.  HENT: Negative.    Eyes: Negative.   Respiratory:  Negative for shortness of breath.   Cardiovascular:  Negative for chest pain, palpitations and leg swelling.  Gastrointestinal:  Negative for abdominal pain, constipation, diarrhea and vomiting.  Endocrine: Negative.   Genitourinary: Negative.  Negative for difficulty urinating, dysuria, frequency, genital sores, hematuria, penile discharge, penile pain, penile swelling, scrotal swelling, testicular pain and urgency.  Musculoskeletal: Negative.  Negative for arthralgias and myalgias.  Skin:  Positive for rash. Negative for color change.  Neurological: Negative.  Negative for dizziness, weakness and light-headedness.  Hematological:  Negative for adenopathy. Does not bruise/bleed easily.  Psychiatric/Behavioral: Negative.  Negative for sleep disturbance.    Objective:  BP 126/84 (BP Location: Right Arm, Patient Position: Sitting, Cuff Size: Large)   Pulse 84   Temp 98.5 F (36.9 C) (Oral)   Ht 5\' 7"  (1.702 m)   Wt 153 lb  (69.4 kg)   SpO2 99%   BMI 23.96 kg/m   BP Readings from Last 3 Encounters:  12/03/20 126/84  07/02/20 120/78  09/27/15 116/73    Wt Readings from Last 3 Encounters:  12/03/20 153 lb (69.4 kg)  07/02/20 149 lb (67.6 kg)  09/27/15 134 lb (60.8 kg)    Physical Exam Vitals reviewed.  HENT:     Nose: Nose normal.     Mouth/Throat:     Mouth: Mucous membranes are moist.  Eyes:     Conjunctiva/sclera: Conjunctivae normal.  Cardiovascular:     Rate and Rhythm: Normal rate and regular rhythm.     Heart sounds: No murmur heard. Pulmonary:     Effort: Pulmonary effort is normal.     Breath sounds: No stridor. No wheezing, rhonchi or rales.  Abdominal:     General: Abdomen is flat.     Palpations: There is no mass.     Tenderness: There is no abdominal tenderness. There is no guarding.     Hernia: No hernia is present.  Genitourinary:    Pubic Area: Rash present.     Penis: Normal and circumcised. No erythema, tenderness, discharge or swelling.      Testes: Normal.        Right: Mass, tenderness or swelling not present.        Left: Mass, tenderness or swelling not present.     Epididymis:     Right: Normal. No mass.     Left: No mass.    Musculoskeletal:        General: Normal range of motion.  Cervical back: Neck supple.  Lymphadenopathy:     Cervical: No cervical adenopathy.  Neurological:     Mental Status: He is alert.    Lab Results  Component Value Date   WBC 7.9 07/28/2015   HGB 12.8 (L) 07/28/2015   HCT 36.5 (L) 07/28/2015   PLT 167 07/28/2015   GLUCOSE 98 07/28/2015   CHOL 266 (H) 12/03/2020   TRIG (H) 12/03/2020    555.0 Triglyceride is over 400; calculations on Lipids are invalid.   HDL 37.60 (L) 12/03/2020   LDLDIRECT 92.0 12/03/2020   LDLCALC 178 03/22/2020   ALT 24 12/03/2020   AST 21 12/03/2020   NA 139 07/28/2015   K 3.4 (L) 07/28/2015   CL 110 07/28/2015   CREATININE 1.0 03/22/2020   BUN 13 03/22/2020   BUN 13 03/22/2020   CO2 24  07/28/2015   TSH 2.11 12/03/2020    US Renal  Result Date: 09/21/2015 CLINICAL DATA:  Renal stones. EXAM: RENAL / URINARY TRACT ULTRASOUND COMPLETE COMPARISON:  08/07/2015.  CT 07/25/2015. FINDINGS: Right Kidney: Length: 10.5 cm. Echogenicity within normal limits. No mass or hydronephrosis visualized. Left Kidney: Length: 10.6 cm. Echogenicity within normal limits. No mass or hydronephrosis visualized. Bladder: Appears normal for degree of bladder distention. IMPRESSION: Negative exam. Electronically Signed   By: Marcello Moores  Register   On: 09/21/2015 14:56    Assessment & Plan:   Donald Reid was seen today for rash.  Diagnoses and all orders for this visit:  Encounter for general adult medical examination with abnormal findings -     Cancel: Lipid panel; Future -     Hepatitis C antibody; Future -     HIV Antibody (routine testing w rflx); Future -     HIV Antibody (routine testing w rflx) -     Hepatitis C antibody  Need for hepatitis C screening test -     Hepatitis C antibody; Future -     Hepatitis C antibody  Hyperlipidemia LDL goal <130- Statin therapy is not indicated. -     Lipid panel; Future -     Hepatic function panel; Future -     TSH; Future -     TSH -     Hepatic function panel -     Lipid panel  Perineal folliculitis -     doxycycline (VIBRA-TABS) 100 MG tablet; Take 1 tablet (100 mg total) by mouth 2 (two) times daily for 10 days.  Primary hypertriglyceridemia- I recommended that he start taking an omega-3 fish oil supplement, avoid alcohol, and decrease his intake of fat and carbohydrates. -     omega-3 acid ethyl esters (LOVAZA) 1 g capsule; Take 2 capsules (2 g total) by mouth 2 (two) times daily.  Other orders -     LDL cholesterol, direct  I am having Donald Reid start on doxycycline and omega-3 acid ethyl esters. I am also having him maintain his ibuprofen.  Meds ordered this encounter  Medications   doxycycline (VIBRA-TABS) 100 MG tablet    Sig: Take  1 tablet (100 mg total) by mouth 2 (two) times daily for 10 days.    Dispense:  20 tablet    Refill:  0   omega-3 acid ethyl esters (LOVAZA) 1 g capsule    Sig: Take 2 capsules (2 g total) by mouth 2 (two) times daily.    Dispense:  360 capsule    Refill:  1      Follow-up: Return in about 3  months (around 03/05/2021).  Sanda Linger, MD

## 2020-12-03 NOTE — Patient Instructions (Signed)
Folliculitis Folliculitis is inflammation of the hair follicles. Folliculitis most commonly occurs on the scalp, thighs, legs, back, and buttocks. However, it can occur anywhere on the body. What are the causes? This condition may be caused by: A bacterial infection (common). A fungal infection. A viral infection. Contact with certain chemicals, especially oils and tars. Shaving or waxing. Greasy ointments or creams applied to the skin. Long-lasting folliculitis and folliculitis that keeps coming back may be caused by bacteria. This bacteria can live anywhere on your skin and is often found in the nostrils. What increases the risk? You are more likely to develop this condition if you have: A weakened immune system. Diabetes. Obesity. What are the signs or symptoms? Symptoms of this condition include: Redness. Soreness. Swelling. Itching. Small white or yellow, pus-filled, itchy spots (pustules) that appear over a reddened area. If there is an infection that goes deep into the follicle, these may develop into a boil (furuncle). A group of closely packed boils (carbuncle). These tend to form in hairy, sweaty areas of the body. How is this diagnosed? This condition is diagnosed with a skin exam. To find what is causing the condition, your health care provider may take a sample of one of the pustules or boils for testing in a lab. How is this treated? This condition may be treated by: Applying warm compresses to the affected areas. Taking an antibiotic medicine or applying an antibiotic medicine to the skin. Applying or bathing with an antiseptic solution. Taking an over-the-counter medicine to help with itching. Having a procedure to drain any pustules or boils. This may be done if a pustule or boil contains a lot of pus or fluid. Having laser hair removal. This may be done to treat long-lasting folliculitis. Follow these instructions at home: Managing pain and swelling  If  directed, apply heat to the affected area as often as told by your health care provider. Use the heat source that your health care provider recommends, such as a moist heat pack or a heating pad. Place a towel between your skin and the heat source. Leave the heat on for 20-30 minutes. Remove the heat if your skin turns bright red. This is especially important if you are unable to feel pain, heat, or cold. You may have a greater risk of getting burned. General instructions If you were prescribed an antibiotic medicine, take it or apply it as told by your health care provider. Do not stop using the antibiotic even if your condition improves. Check the irritated area every day for signs of infection. Check for: Redness, swelling, or pain. Fluid or blood. Warmth. Pus or a bad smell. Do not shave irritated skin. Take over-the-counter and prescription medicines only as told by your health care provider. Keep all follow-up visits as told by your health care provider. This is important. Get help right away if: You have more redness, swelling, or pain in the affected area. Red streaks are spreading from the affected area. You have a fever. Summary Folliculitis is inflammation of the hair follicles. Folliculitis most commonly occurs on the scalp, thighs, legs, back, and buttocks. This condition may be treated by taking an antibiotic medicine or applying an antibiotic medicine to the skin, and applying or bathing with an antiseptic solution. If you were prescribed an antibiotic medicine, take it or apply it as told by your health care provider. Do not stop using the antibiotic even if your condition improves. Get help right away if you have new or   worsening symptoms. Keep all follow-up visits as told by your health care provider. This is important. This information is not intended to replace advice given to you by your health care provider. Make sure you discuss any questions you have with your health  care provider. Document Revised: 08/14/2017 Document Reviewed: 08/22/2017 Elsevier Patient Education  2022 [employer]  

## 2020-12-04 DIAGNOSIS — E781 Pure hyperglyceridemia: Secondary | ICD-10-CM | POA: Insufficient documentation

## 2020-12-04 LAB — HEPATITIS C ANTIBODY
Hepatitis C Ab: NONREACTIVE
SIGNAL TO CUT-OFF: 0.04 (ref <1.00)

## 2020-12-04 LAB — HIV ANTIBODY (ROUTINE TESTING W REFLEX): HIV 1&2 Ab, 4th Generation: NONREACTIVE

## 2020-12-04 MED ORDER — OMEGA-3-ACID ETHYL ESTERS 1 G PO CAPS: 2.0000 g | ORAL_CAPSULE | Freq: Two times a day (BID) | ORAL | 1 refills | Status: DC

## 2022-11-09 ENCOUNTER — Emergency Department (HOSPITAL_COMMUNITY): Payer: Managed Care, Other (non HMO)

## 2022-11-09 ENCOUNTER — Other Ambulatory Visit: Payer: Self-pay

## 2022-11-09 ENCOUNTER — Emergency Department (HOSPITAL_COMMUNITY)
Admission: EM | Admit: 2022-11-09 | Discharge: 2022-11-09 | Disposition: A | Discharge status: Home / Self Care | Payer: Managed Care, Other (non HMO) | Attending: Emergency Medicine | Admitting: Emergency Medicine

## 2022-11-09 ENCOUNTER — Encounter (HOSPITAL_COMMUNITY): Payer: Self-pay

## 2022-11-09 DIAGNOSIS — R109 Unspecified abdominal pain: Secondary | ICD-10-CM

## 2022-11-09 DIAGNOSIS — N200 Calculus of kidney: Secondary | ICD-10-CM | POA: Insufficient documentation

## 2022-11-09 LAB — COMPREHENSIVE METABOLIC PANEL
ALT: 22 U/L (ref 0–44)
AST: 20 U/L (ref 15–41)
Albumin: 3.8 g/dL (ref 3.5–5.0)
Alkaline Phosphatase: 66 U/L (ref 38–126)
Anion gap: 10 (ref 5–15)
BUN: 12 mg/dL (ref 6–20)
CO2: 22 mmol/L (ref 22–32)
Calcium: 8.7 mg/dL — ABNORMAL LOW (ref 8.9–10.3)
Chloride: 107 mmol/L (ref 98–111)
Creatinine, Ser: 1.14 mg/dL (ref 0.61–1.24)
GFR, Estimated: >60 mL/min (ref >60)
Glucose, Bld: 113 mg/dL — ABNORMAL HIGH (ref 70–99)
Potassium: 4.2 mmol/L (ref 3.5–5.1)
Sodium: 139 mmol/L (ref 135–145)
Total Bilirubin: 0.4 mg/dL (ref 0.3–1.2)
Total Protein: 6.2 g/dL — ABNORMAL LOW (ref 6.5–8.1)

## 2022-11-09 LAB — URINALYSIS, ROUTINE W REFLEX MICROSCOPIC
Bacteria, UA: NONE SEEN
Bilirubin Urine: NEGATIVE
Glucose, UA: NEGATIVE mg/dL
Ketones, ur: NEGATIVE mg/dL
Leukocytes,Ua: NEGATIVE
Nitrite: NEGATIVE
Protein, ur: 100 mg/dL — AB
RBC / HPF: >50 RBC/hpf (ref 0–5)
Specific Gravity, Urine: 1.023 (ref 1.005–1.030)
pH: 6 (ref 5.0–8.0)

## 2022-11-09 LAB — CBC WITH DIFFERENTIAL/PLATELET
Abs Immature Granulocytes: 0.04 10*3/uL (ref 0.00–0.07)
Basophils Absolute: 0 10*3/uL (ref 0.0–0.1)
Basophils Relative: 0 %
Eosinophils Absolute: 0.1 10*3/uL (ref 0.0–0.5)
Eosinophils Relative: 1 %
HCT: 44.6 % (ref 39.0–52.0)
Hemoglobin: 15.5 g/dL (ref 13.0–17.0)
Immature Granulocytes: 0 %
Lymphocytes Relative: 11 %
Lymphs Abs: 1.1 10*3/uL (ref 0.7–4.0)
MCH: 31.8 pg (ref 26.0–34.0)
MCHC: 34.8 g/dL (ref 30.0–36.0)
MCV: 91.4 fL (ref 80.0–100.0)
Monocytes Absolute: 0.5 10*3/uL (ref 0.1–1.0)
Monocytes Relative: 5 %
Neutro Abs: 8.1 10*3/uL — ABNORMAL HIGH (ref 1.7–7.7)
Neutrophils Relative %: 83 %
Platelets: 184 10*3/uL (ref 150–400)
RBC: 4.88 MIL/uL (ref 4.22–5.81)
RDW: 12.5 % (ref 11.5–15.5)
WBC: 9.8 10*3/uL (ref 4.0–10.5)
nRBC: 0 % (ref 0.0–0.2)

## 2022-11-09 LAB — LACTIC ACID, PLASMA: Lactic Acid, Venous: 1.7 mmol/L (ref 0.5–1.9)

## 2022-11-09 MED ORDER — OXYCODONE-ACETAMINOPHEN 5-325 MG PO TABS
1.0000 | ORAL_TABLET | ORAL | 0 refills | Status: DC | PRN
Start: 2022-11-09 — End: 2022-11-09

## 2022-11-09 MED ORDER — KETOROLAC TROMETHAMINE 10 MG PO TABS: 10.0000 mg | ORAL_TABLET | Freq: Four times a day (QID) | ORAL | 0 refills | Status: AC

## 2022-11-09 MED ORDER — KETOROLAC TROMETHAMINE 30 MG/ML IJ SOLN
30.0000 mg | Freq: Once | INTRAMUSCULAR | Status: AC
  Administered 2022-11-09: 30 mg via INTRAVENOUS
  Filled 2022-11-09: qty 1

## 2022-11-09 MED ORDER — TAMSULOSIN HCL 0.4 MG PO CAPS
0.4000 mg | ORAL_CAPSULE | Freq: Every day | ORAL | Status: DC
  Filled 2022-11-09: qty 1

## 2022-11-09 MED ORDER — ONDANSETRON HCL 4 MG/2ML IJ SOLN
4.0000 mg | Freq: Once | INTRAMUSCULAR | Status: AC
  Administered 2022-11-09: 4 mg via INTRAVENOUS
  Filled 2022-11-09: qty 2

## 2022-11-09 MED ORDER — HYDROMORPHONE HCL 1 MG/ML IJ SOLN
1.0000 mg | Freq: Once | INTRAMUSCULAR | Status: AC
  Administered 2022-11-09: 1 mg via INTRAVENOUS
  Filled 2022-11-09: qty 1

## 2022-11-09 MED ORDER — MAGNESIUM SULFATE 2 GM/50ML IV SOLN
2.0000 g | Freq: Once | INTRAVENOUS | Status: AC
  Administered 2022-11-09: 2 g via INTRAVENOUS
  Filled 2022-11-09: qty 50

## 2022-11-09 MED ORDER — TAMSULOSIN HCL 0.4 MG PO CAPS: 0.4000 mg | ORAL_CAPSULE | Freq: Every day | ORAL | 0 refills | Status: DC

## 2022-11-09 MED ORDER — ONDANSETRON 4 MG PO TBDP: 4.0000 mg | ORAL_TABLET | Freq: Three times a day (TID) | ORAL | 0 refills | Status: DC | PRN

## 2022-11-09 MED ORDER — OXYCODONE-ACETAMINOPHEN 5-325 MG PO TABS
1.0000 | ORAL_TABLET | ORAL | 0 refills | Status: DC | PRN
Start: 2022-11-09 — End: 2022-11-14

## 2022-11-09 NOTE — Discharge Instructions (Signed)
Your history, exam, workup today revealed a 9 mm stone near your right kidney causing your flank pain nausea and vomiting today.  You did not have evidence of infection and your kidney function was normal thus the urology team we spoke with felt you are safe for discharge home to call tomorrow to discuss outpatient management.  Please use the pain medicine, scheduled Toradol, Tylenol, and Flomax daily to help with the pain.  Please call them tomorrow to discuss outpatient management.  If symptoms were to escalate, change, or worsen or you cannot tolerate the pain any further, please return to [facility] emergency department to see urology.

## 2022-11-09 NOTE — ED Notes (Signed)
RN sent down light green tube again per labs request

## 2022-11-09 NOTE — ED Provider Notes (Signed)
[place] [facility] AT [facility] Provider Note   CSN: [id number] Arrival date & time: 11/09/22  0813     History  Chief Complaint  Patient presents with   Flank Pain    Donald Reid is a 37 y.o. male.  The history is provided by the patient and medical records. No language interpreter was used.  Flank Pain This is a recurrent problem. The current episode started 3 to 5 hours ago. The problem occurs constantly. The problem has not changed since onset.Associated symptoms include abdominal pain. Pertinent negatives include no chest pain, no headaches and no shortness of breath. Nothing aggravates the symptoms. Nothing relieves the symptoms. He has tried nothing for the symptoms. The treatment provided no relief.       Home Medications Prior to Admission medications   Medication Sig Start Date End Date Taking? Authorizing Provider  ibuprofen (ADVIL,MOTRIN) 200 MG tablet Take 200-600 mg by mouth every 6 (six) hours as needed for fever, headache or mild pain. Reported on 08/16/2015    [provider]  omega-3 acid ethyl esters (LOVAZA) 1 g capsule Take 2 capsules (2 g total) by mouth 2 (two) times daily. 12/04/20   Etta Grandchild, MD      Allergies    Patient has no known allergies.    Review of Systems   Review of Systems  Constitutional:  Negative for chills and fever.  HENT:  Negative for congestion.   Respiratory:  Negative for cough, chest tightness, shortness of breath and wheezing.   Cardiovascular:  Negative for chest pain.  Gastrointestinal:  Positive for abdominal pain, nausea and vomiting. Negative for constipation and diarrhea.  Genitourinary:  Positive for flank pain and penile discharge. Negative for difficulty urinating, dysuria, frequency, penile pain, penile swelling, scrotal swelling and testicular pain.  Musculoskeletal:  Positive for back pain. Negative for neck pain and neck stiffness.  Skin:  Negative for wound.   Neurological:  Negative for headaches.  Psychiatric/Behavioral:  Negative for agitation.   All other systems reviewed and are negative.   Physical Exam Updated Vital Signs BP 114/85 (BP Location: Right Arm)   Pulse 100   Temp (!) 97.4 F (36.3 C) (Oral)   Resp 18   Ht 5\' 7"  (1.702 m)   Wt 68.9 kg   SpO2 100%   BMI 23.81 kg/m  Physical Exam Vitals and nursing note reviewed.  Constitutional:      General: He is not in acute distress.    Appearance: He is well-developed. He is not ill-appearing, toxic-appearing or diaphoretic.  HENT:     Head: Normocephalic and atraumatic.     Nose: No congestion or rhinorrhea.     Mouth/Throat:     Pharynx: No oropharyngeal exudate or posterior oropharyngeal erythema.  Eyes:     Extraocular Movements: Extraocular movements intact.     Conjunctiva/sclera: Conjunctivae normal.     Pupils: Pupils are equal, round, and reactive to light.  Cardiovascular:     Rate and Rhythm: Normal rate and regular rhythm.     Heart sounds: No murmur heard. Pulmonary:     Effort: Pulmonary effort is normal. No respiratory distress.     Breath sounds: Normal breath sounds. No wheezing, rhonchi or rales.  Chest:     Chest wall: No tenderness.  Abdominal:     General: Abdomen is flat.     Palpations: Abdomen is soft.     Tenderness: There is no abdominal tenderness. There is right  CVA tenderness. There is no left CVA tenderness, guarding or rebound.  Musculoskeletal:        General: Tenderness present. No swelling.     Cervical back: Neck supple. No tenderness.  Skin:    General: Skin is warm and dry.     Capillary Refill: Capillary refill takes less than 2 seconds.     Findings: No erythema or rash.  Neurological:     General: No focal deficit present.     Mental Status: He is alert.  Psychiatric:        Mood and Affect: Mood normal.     ED Results / Procedures / Treatments   Labs (all labs ordered are listed, but only abnormal results are  displayed) Labs Reviewed  URINALYSIS, ROUTINE W REFLEX MICROSCOPIC - Abnormal; Notable for the following components:      Result Value   APPearance CLOUDY (*)    Hgb urine dipstick LARGE (*)    Protein, ur 100 (*)    All other components within normal limits  CBC WITH DIFFERENTIAL/PLATELET - Abnormal; Notable for the following components:   Neutro Abs 8.1 (*)    All other components within normal limits  COMPREHENSIVE METABOLIC PANEL - Abnormal; Notable for the following components:   Glucose, Bld 113 (*)    Calcium 8.7 (*)    Total Protein 6.2 (*)    All other components within normal limits  URINE CULTURE  LACTIC ACID, PLASMA    EKG None  Radiology CT Renal Stone Study  Result Date: 11/09/2022 CLINICAL DATA:  Right-sided flank and abdominal pain EXAM: CT ABDOMEN AND PELVIS WITHOUT CONTRAST TECHNIQUE: Multidetector CT imaging of the abdomen and pelvis was performed following the standard protocol without IV contrast. RADIATION DOSE REDUCTION: This exam was performed according to the departmental dose-optimization program which includes automated exposure control, adjustment of the mA and/or kV according to patient size and/or use of iterative reconstruction technique. COMPARISON:  Prior CT scan of the abdomen and pelvis 07/25/2015 FINDINGS: Lower chest: No acute abnormality. Hepatobiliary: No focal liver abnormality is seen. No gallstones, gallbladder wall thickening, or biliary dilatation. Pancreas: Unremarkable. No pancreatic ductal dilatation or surrounding inflammatory changes. Spleen: Normal in size without focal abnormality. Adrenals/Urinary Tract: Approximately 9 mm stone present in the right renal pelvis. All no significant hydronephrosis although there is some inflammatory stranding at the renal pelvis. No other stones are visualized. The ureters are normal. Unremarkable appearance of the bladder. Stomach/Bowel: No evidence of obstruction or focal bowel wall thickening. Normal  appendix in the right lower quadrant. The terminal ileum is unremarkable. Vascular/Lymphatic: Limited evaluation in the absence of intravenous contrast. No aneurysm or significant atherosclerotic plaque. No suspicious lymphadenopathy. Reproductive: Prostate is unremarkable. Other: No abdominal wall hernia or abnormality. No abdominopelvic ascites. Musculoskeletal: No acute or significant osseous findings. IMPRESSION: Approximately 9 mm nonobstructing stone in the right ureteropelvic junction. While there is no hydronephrosis, there is some mild inflammatory stranding about the renal pelvis. Otherwise, unremarkable CT scan of the abdomen and pelvis. Electronically Signed   By: Malachy Moan M.D.   On: 11/09/2022 09:16    Procedures Procedures    Medications Ordered in ED Medications  tamsulosin (FLOMAX) capsule 0.4 mg (0.4 mg Oral Patient Refused/Not Given 11/09/22 1116)  HYDROmorphone (DILAUDID) injection 1 mg (0 mg Intravenous Hold 11/09/22 1116)  HYDROmorphone (DILAUDID) injection 1 mg (1 mg Intravenous Given 11/09/22 0929)  magnesium sulfate IVPB 2 g 50 mL (0 g Intravenous Stopped 11/09/22 1057)  ondansetron (ZOFRAN)  injection 4 mg (4 mg Intravenous Given 11/09/22 0928)  ketorolac (TORADOL) 30 MG/ML injection 30 mg (30 mg Intravenous Given 11/09/22 1141)    ED Course/ Medical Decision Making/ A&P                                 Medical Decision Making Amount and/or Complexity of Data Reviewed Labs: ordered. Radiology: ordered.  Risk Prescription drug management.    DONYEA BEVERLIN is a 37 y.o. male with a past medical history significant for previous kidney stone requiring stenting lithotripsy, hyperlipidemia, previous inguinal hernia status post repair, and anxiety who presents with severe right-sided flank pain with nausea and vomiting.  According to patient, he has been feeling normal last few days and then started having severe pain overnight with his right flank, right back,  and right lower abdomen.  He has never had appendicitis and denies any trauma.  He reports some nausea and vomiting with it.  Reports no testicle or groin pain at this time.  Denies any problems in his inguinal areas recently.  Denies any rash to suggest shingles.  He denies any fevers, chills, congestion, cough, or other complaints.  He reports the pain is 7 out of 10 and he is very uncomfortable.  On exam, lungs clear.  Chest nontender.  Right abdomen was not tender but his right CVA area and right flank was slightly tender.  No rash.  Normal bowel sounds.  Lungs clear.  Patient uncomfortable.  He deferred GU exam when offered due to lack of testicle symptoms at this time.  Clinically I am concerned that recurrent large stone.  Will get stone study, give him some pain medicine, nausea medicine, and some magnesium, will have her remain n.p.o., and will get urinalysis with culture and labs.  Anticipate reassessment after workup to determine disposition.  CT scan returned showing a 9 mm ureteropelvic junction stone with some inflammation but no large hydronephrosis.  It is not obstructive at this time.  Will call urology and wait for labs to return  Urology initially felt the patient will likely be a candidate for outpatient management of this kidney stone.  They did want to see what the labs show to make sure is not infected.  Urinalysis did not show convincing evidence of infection so we will give Toradol and see if the patient is feeling better.  Patient continues to feel as sick as he was earlier, patient may need to have urological evaluation today rather than as an outpatient.  I give the patient more medicine and reassess.  Urology feels patient is safe for discharge home.  I spoke to him and patient is amenable to try going home to call urology tomorrow to discuss outpatient follow-up although he we will observe strict turn precautions if symptoms change or worsen acutely or do not improve, he  will go to the [facility] emergency department to continue management.        Final Clinical Impression(s) / ED Diagnoses Final diagnoses:  Right flank pain  Kidney stone    Rx / DC Orders ED Discharge Orders          Ordered    ketorolac (TORADOL) 10 MG tablet  Every 6 hours        11/09/22 1236    oxyCODONE-acetaminophen (PERCOCET/ROXICET) 5-325 MG tablet  Every 4 hours PRN,   Status:  Discontinued        11/09/22  1236    ondansetron (ZOFRAN-ODT) 4 MG disintegrating tablet  Every 8 hours PRN        11/09/22 1236    tamsulosin (FLOMAX) 0.4 MG CAPS capsule  Daily        11/09/22 1236    oxyCODONE-acetaminophen (PERCOCET/ROXICET) 5-325 MG tablet  Every 4 hours PRN        11/09/22 1236            Clinical Impression: 1. Right flank pain   2. Kidney stone     Disposition: Discharge  Condition: Good  I have discussed the results, Dx and Tx plan with the pt(& family if present). He/she/they expressed understanding and agree(s) with the plan. Discharge instructions discussed at great length. Strict return precautions discussed and pt &/or family have verbalized understanding of the instructions. No further questions at time of discharge.    New Prescriptions   KETOROLAC (TORADOL) 10 MG TABLET    Take 1 tablet (10 mg total) by mouth every 6 (six) hours for 5 days.   ONDANSETRON (ZOFRAN-ODT) 4 MG DISINTEGRATING TABLET    Take 1 tablet (4 mg total) by mouth every 8 (eight) hours as needed for nausea or vomiting.   OXYCODONE-ACETAMINOPHEN (PERCOCET/ROXICET) 5-325 MG TABLET    Take 1 tablet by mouth every 4 (four) hours as needed for severe pain.   TAMSULOSIN (FLOMAX) 0.4 MG CAPS CAPSULE    Take 1 capsule (0.4 mg total) by mouth daily.    Follow Up: [facility] [address] [place] [address] [phone]        Garlon Tuggle, Canary Brim, MD 11/09/22 1244

## 2022-11-09 NOTE — ED Triage Notes (Signed)
Pt arrived POV from home c/o right flank pain and hematuria that started around 5am this morning. Pt also endorses N/V and had an episode in triage.

## 2022-11-10 LAB — URINE CULTURE: Culture: NO GROWTH

## 2022-11-11 ENCOUNTER — Other Ambulatory Visit: Payer: Self-pay | Admitting: Urology

## 2022-11-12 NOTE — Progress Notes (Signed)
Talked with patient. Arrival time 0800 Noting to eat or drink after MN. Instructions given. HX and meds reviewed.Mom is the driver. Bring blue folder in on friday

## 2022-11-14 ENCOUNTER — Ambulatory Visit (HOSPITAL_BASED_OUTPATIENT_CLINIC_OR_DEPARTMENT_OTHER)
Admission: RE | Admit: 2022-11-14 | Discharge: 2022-11-14 | Disposition: A | Discharge status: Home / Self Care | Payer: Managed Care, Other (non HMO) | Attending: Urology | Admitting: Urology

## 2022-11-14 ENCOUNTER — Encounter (HOSPITAL_BASED_OUTPATIENT_CLINIC_OR_DEPARTMENT_OTHER): Payer: Self-pay | Admitting: Urology

## 2022-11-14 ENCOUNTER — Ambulatory Visit (HOSPITAL_COMMUNITY): Payer: Managed Care, Other (non HMO)

## 2022-11-14 ENCOUNTER — Encounter (HOSPITAL_BASED_OUTPATIENT_CLINIC_OR_DEPARTMENT_OTHER)
Admission: RE | Disposition: A | Discharge status: Home / Self Care | Payer: Self-pay | Source: Home / Self Care | Attending: Urology

## 2022-11-14 ENCOUNTER — Other Ambulatory Visit: Payer: Self-pay

## 2022-11-14 DIAGNOSIS — N201 Calculus of ureter: Secondary | ICD-10-CM | POA: Diagnosis present

## 2022-11-14 DIAGNOSIS — N200 Calculus of kidney: Secondary | ICD-10-CM

## 2022-11-14 HISTORY — PX: EXTRACORPOREAL SHOCK WAVE LITHOTRIPSY: SHX1557

## 2022-11-14 SURGERY — LITHOTRIPSY, ESWL
Anesthesia: LOCAL | Laterality: Right

## 2022-11-14 MED ORDER — OXYCODONE HCL 5 MG PO TABS: 5.0000 mg | ORAL_TABLET | Freq: Once | ORAL | Status: DC | PRN

## 2022-11-14 MED ORDER — ONDANSETRON HCL 4 MG/2ML IJ SOLN
INTRAMUSCULAR | Status: AC
  Filled 2022-11-14: qty 2

## 2022-11-14 MED ORDER — CIPROFLOXACIN HCL 500 MG PO TABS
ORAL_TABLET | ORAL | Status: AC
  Filled 2022-11-14: qty 1

## 2022-11-14 MED ORDER — DIPHENHYDRAMINE HCL 25 MG PO CAPS
25.0000 mg | ORAL_CAPSULE | ORAL | Status: AC
  Administered 2022-11-14: 25 mg via ORAL

## 2022-11-14 MED ORDER — SODIUM CHLORIDE 0.9 % IV SOLN: INTRAVENOUS | Status: DC

## 2022-11-14 MED ORDER — ONDANSETRON HCL 4 MG/2ML IJ SOLN
4.0000 mg | Freq: Once | INTRAMUSCULAR | Status: AC
  Administered 2022-11-14: 4 mg via INTRAVENOUS

## 2022-11-14 MED ORDER — CIPROFLOXACIN HCL 500 MG PO TABS
500.0000 mg | ORAL_TABLET | Freq: Once | ORAL | Status: AC
  Administered 2022-11-14: 500 mg via ORAL

## 2022-11-14 MED ORDER — KETOROLAC TROMETHAMINE 30 MG/ML IJ SOLN
INTRAMUSCULAR | Status: AC
  Filled 2022-11-14: qty 1

## 2022-11-14 MED ORDER — DIAZEPAM 5 MG PO TABS
ORAL_TABLET | ORAL | Status: AC
  Filled 2022-11-14: qty 2

## 2022-11-14 MED ORDER — CIPROFLOXACIN IN D5W 400 MG/200ML IV SOLN: 400.0000 mg | INTRAVENOUS | Status: DC

## 2022-11-14 MED ORDER — KETOROLAC TROMETHAMINE 30 MG/ML IJ SOLN
30.0000 mg | Freq: Once | INTRAMUSCULAR | Status: AC
  Administered 2022-11-14: 30 mg via INTRAVENOUS

## 2022-11-14 MED ORDER — DIAZEPAM 5 MG PO TABS
10.0000 mg | ORAL_TABLET | ORAL | Status: AC
  Administered 2022-11-14: 10 mg via ORAL

## 2022-11-14 MED ORDER — DIPHENHYDRAMINE HCL 25 MG PO CAPS
ORAL_CAPSULE | ORAL | Status: AC
  Filled 2022-11-14: qty 1

## 2022-11-14 NOTE — Op Note (Signed)
See [facility] OP note scanned into chart. Also because of the size, density, location and other factors that cannot be anticipated I feel this will likely be a staged procedure. This fact supersedes any indication in the scanned [facility] operative note to the contrary.  

## 2022-11-14 NOTE — Discharge Instructions (Signed)
See [facility] discharge instructions in chart.  

## 2022-11-14 NOTE — H&P (Signed)
Right proximal ureteral stone   Patient presents today for further management and evaluation of her right proximal ureteral stone. The patient presented to the emergency department 2 days prior with acute onset right-sided flank pain. He is having associated nausea. In the ER he was noted to have an 8 mm right proximal ureteral stone. He was treated with pain medication and discharged home. He is here today for further management.   The patient states that his pain today is reasonably well-controlled. Denies any associated fevers or chills. Denies any dysuria or gross hematuria. He is anxious about missing work, but otherwise has okay today.   The patient underwent kidney stone procedure in 2017. He was admitted to the hospital for pain control and developed urosepsis and had to have a stent placed. He would like to avoid stent if possible.   The patient is otherwise healthy.       ALLERGIES: None   MEDICATIONS: Ketorolac Tromethamine  Tamsulosin Hcl 0.4 mg capsule  Ondansetron Hcl  Oxycodone Hcl     GU PSH: No GU PSH      PSH Notes: hernia repair, stone removal    NON-GU PSH: No Non-GU PSH    GU PMH: No GU PMH    NON-GU PMH: No Non-GU PMH    FAMILY HISTORY: Kidney Stones - Runs in Family   SOCIAL HISTORY: Marital Status: Married Current Smoking Status: Patient smokes.   Tobacco Use Assessment Completed: Used Tobacco in last 30 days? Has never drank.  Drinks 4+ caffeinated drinks per day.    REVIEW OF SYSTEMS:    GU Review Male:   Patient reports get up at night to urinate, stream starts and stops, and trouble starting your stream. Patient denies frequent urination, hard to postpone urination, burning/ pain with urination, leakage of urine, have to strain to urinate , erection problems, and penile pain.  Gastrointestinal (Upper):   Patient denies nausea, vomiting, and indigestion/ heartburn.  Gastrointestinal (Lower):   Patient denies diarrhea and constipation.   Constitutional:   Patient denies fever, night sweats, weight loss, and fatigue.  Skin:   Patient denies skin rash/ lesion and itching.  Eyes:   Patient denies blurred vision and double vision.  Ears/ Nose/ Throat:   Patient denies sore throat and sinus problems.  Hematologic/Lymphatic:   Patient denies swollen glands and easy bruising.  Cardiovascular:   Patient denies leg swelling and chest pains.  Respiratory:   Patient denies cough and shortness of breath.  Endocrine:   Patient denies excessive thirst.  Musculoskeletal:   Patient denies back pain and joint pain.  Neurological:   Patient denies headaches and dizziness.  Psychologic:   Patient denies depression and anxiety.   VITAL SIGNS:      11/11/2022 09:26 AM  Weight 152 lb / 68.95 kg  BP 134/83 mmHg  Pulse 66 /min  Temperature 98.2 F / 36.7 C   MULTI-SYSTEM PHYSICAL EXAMINATION:    Constitutional: Well-nourished. No physical deformities. Normally developed. Good grooming.  Neck: Neck symmetrical, not swollen. Normal tracheal position.  Respiratory: Normal breath sounds. No labored breathing, no use of accessory muscles.   Cardiovascular: Regular rate and rhythm. No murmur, no gallop. Normal temperature, normal extremity pulses, no swelling, no varicosities.   Lymphatic: No enlargement of neck, axillae, groin.  Skin: No paleness, no jaundice, no cyanosis. No lesion, no ulcer, no rash.  Neurologic / Psychiatric: Oriented to time, oriented to place, oriented to person. No depression, no anxiety, no agitation.  Gastrointestinal:  No mass, no tenderness, no rigidity, non obese abdomen.  Eyes: Normal conjunctivae. Normal eyelids.  Ears, Nose, Mouth, and Throat: Left ear no scars, no lesions, no masses. Right ear no scars, no lesions, no masses. Nose no scars, no lesions, no masses. Normal hearing. Normal lips.  Musculoskeletal: Normal gait and station of head and neck.     Complexity of Data:  Source Of History:  Patient  Records  Review:   Previous Doctor Records, Previous Patient Records  Urine Test Review:   Urinalysis  X-Ray Review: C.T. Abdomen/Pelvis: Reviewed Films. Discussed With Patient.     PROCEDURES:          Urinalysis w/Scope Dipstick Dipstick Cont'd Micro  Color: Yellow Bilirubin: Neg mg/dL WBC/hpf: 0 - 5/hpf  Appearance: Slightly Cloudy Ketones: Neg mg/dL RBC/hpf: 40 - 16/XWR  Specific Gravity: 1.020 Blood: 3+ ery/uL Bacteria: Few (10-25/hpf)  pH: 6.0 Protein: Trace mg/dL Cystals: NS (Not Seen)  Glucose: Neg mg/dL Urobilinogen: 0.2 mg/dL Casts: NS (Not Seen)    Nitrites: Neg Trichomonas: Not Present    Leukocyte Esterase: Neg leu/uL Mucous: Present      Epithelial Cells: NS (Not Seen)      Yeast: NS (Not Seen)      Sperm: Not Present    ASSESSMENT:      ICD-10 Details  1 GU:   Ureteral calculus - N20.1    PLAN:            Medications New Meds: Tramadol Hcl 50 mg tablet 1-2 tablet PO Q 6 H PRN   #10  0 Refill(s)  Pharmacy Name:  [id number]  Address:  [address]   [place], [place] [address]  Phone:  [phone]  Fax:  [phone]            Document Letter(s):  Created for Patient: Clinical Summary         Notes:   Patient has a right proximal ureteral stone. Fortunately his pain is relatively well-controlled today. We discussed management strategies and The patient has opted to proceed with shockwave lithotripsy. I went through the risk and associated benefits of it. He understands that there is a chance of failure and the sense that he still has to pass his fragments and has associated pain with that. He understands that he may need additional procedures. He also understands the risks of hematoma and bleeding. After going through all of that, the patient is opted to proceed.

## 2022-11-17 ENCOUNTER — Encounter (HOSPITAL_BASED_OUTPATIENT_CLINIC_OR_DEPARTMENT_OTHER): Payer: Self-pay | Admitting: Urology

## 2022-11-17 NOTE — Plan of Care (Signed)
[facility] Tonsillectomy/Adenoidectomy, Postoperative PEDS care plan entered in error.

## 2023-05-12 ENCOUNTER — Ambulatory Visit: Admitting: Internal Medicine

## 2023-05-12 ENCOUNTER — Encounter: Payer: Self-pay | Admitting: Internal Medicine

## 2023-05-12 VITALS — BP 114/82 | HR 81 | Temp 98.5°F | Ht 67.0 in | Wt 152.6 lb

## 2023-05-12 DIAGNOSIS — B356 Tinea cruris: Secondary | ICD-10-CM

## 2023-05-12 DIAGNOSIS — Z23 Encounter for immunization: Secondary | ICD-10-CM | POA: Diagnosis not present

## 2023-05-12 DIAGNOSIS — Z0000 Encounter for general adult medical examination without abnormal findings: Secondary | ICD-10-CM

## 2023-05-12 DIAGNOSIS — K4090 Unilateral inguinal hernia, without obstruction or gangrene, not specified as recurrent: Secondary | ICD-10-CM

## 2023-05-12 DIAGNOSIS — Z0001 Encounter for general adult medical examination with abnormal findings: Secondary | ICD-10-CM

## 2023-05-12 MED ORDER — CICLOPIROX OLAMINE 0.77 % EX CREA
TOPICAL_CREAM | Freq: Two times a day (BID) | CUTANEOUS | 1 refills | Status: AC
Start: 2023-05-12 — End: ?

## 2023-05-12 NOTE — Progress Notes (Unsigned)
 Subjective:  Patient ID: Donald Reid, male    DOB: 18-Jan-1986  Age: 38 y.o. MRN: [id number]  CC: Annual Exam   HPI Donald Reid presents for ***  Outpatient Medications Prior to Visit  Medication Sig Dispense Refill   ibuprofen (ADVIL) 200 MG tablet Take 200 mg by mouth every 6 (six) hours as needed for mild pain (pain score 1-3).     ondansetron (ZOFRAN-ODT) 4 MG disintegrating tablet Take 1 tablet (4 mg total) by mouth every 8 (eight) hours as needed for nausea or vomiting. 20 tablet 0   tamsulosin (FLOMAX) 0.4 MG CAPS capsule Take 1 capsule (0.4 mg total) by mouth daily. 7 capsule 0   No facility-administered medications prior to visit.    ROS Review of Systems  Objective:  BP 114/82 (BP Location: Left Arm, Patient Position: Sitting, Cuff Size: Normal)   Pulse 81   Temp 98.5 F (36.9 C) (Oral)   Ht 5\' 7"  (1.702 m)   Wt 152 lb 9.6 oz (69.2 kg)   SpO2 99%   BMI 23.90 kg/m   BP Readings from Last 3 Encounters:  05/12/23 114/82  11/14/22 124/84  11/09/22 113/85    Wt Readings from Last 3 Encounters:  05/12/23 152 lb 9.6 oz (69.2 kg)  11/14/22 153 lb (69.4 kg)  11/09/22 152 lb (68.9 kg)    Physical Exam Vitals reviewed.  Constitutional:      Appearance: Normal appearance.  HENT:     Nose: Nose normal.     Mouth/Throat:     Mouth: Mucous membranes are moist.  Eyes:     General: No scleral icterus.    Conjunctiva/sclera: Conjunctivae normal.  Cardiovascular:     Rate and Rhythm: Normal rate and regular rhythm.     Heart sounds: No murmur heard.    No friction rub. No gallop.  Pulmonary:     Effort: Pulmonary effort is normal.     Breath sounds: No stridor. No wheezing, rhonchi or rales.  Abdominal:     General: Abdomen is flat.     Palpations: There is no mass.     Tenderness: There is no abdominal tenderness. There is no guarding or rebound.     Hernia: A hernia is present. Hernia is present in the left inguinal area. There is no hernia in the  right inguinal area.  Genitourinary:    Pubic Area: Rash present.     Penis: Normal and circumcised.      Testes: Normal.     Epididymis:     Right: Normal.     Left: Normal.    Musculoskeletal:        General: Normal range of motion.     Cervical back: Neck supple.     Right lower leg: No edema.     Left lower leg: No edema.  Lymphadenopathy:     Cervical: No cervical adenopathy.     Lower Body: No right inguinal adenopathy. No left inguinal adenopathy.  Skin:    General: Skin is warm and dry.     Coloration: Skin is not jaundiced.     Findings: Erythema and rash present. No bruising or lesion.  Neurological:     General: No focal deficit present.     Mental Status: He is alert.  Psychiatric:        Mood and Affect: Mood normal.        Behavior: Behavior normal.     Lab Results  Component Value Date  WBC 9.8 11/09/2022   HGB 15.5 11/09/2022   HCT 44.6 11/09/2022   PLT 184 11/09/2022   GLUCOSE 113 (H) 11/09/2022   CHOL 266 (H) 12/03/2020   TRIG (H) 12/03/2020    555.0 Triglyceride is over 400; calculations on Lipids are invalid.   HDL 37.60 (L) 12/03/2020   LDLDIRECT 92.0 12/03/2020   LDLCALC 178 03/22/2020   ALT 22 11/09/2022   AST 20 11/09/2022   NA 139 11/09/2022   K 4.2 11/09/2022   CL 107 11/09/2022   CREATININE 1.14 11/09/2022   BUN 12 11/09/2022   CO2 22 11/09/2022   TSH 2.11 12/03/2020    DG Abd 1 View Result Date: 11/15/2022 CLINICAL DATA:  Preop lithotripsy. EXAM: ABDOMEN - 1 VIEW COMPARISON:  CT 11/09/2022. FINDINGS: The bowel gas pattern is normal. N calcification consistent with known renal pelvis stone on the right measuring 8 mm. IMPRESSION: Proximal right ureteral stone. Electronically Signed   By: Sydell Eva M.D.   On: 11/15/2022 16:47    Assessment & Plan:  There are no diagnoses linked to this encounter.   Follow-up: No follow-ups on file.  Sandra Crouch, MD

## 2023-05-12 NOTE — Patient Instructions (Signed)
 Health Maintenance, Male  Adopting a healthy lifestyle and getting preventive care are important in promoting health and wellness. Ask your health care provider about:  The right schedule for you to have regular tests and exams.  Things you can do on your own to prevent diseases and keep yourself healthy.  What should I know about diet, weight, and exercise?  Eat a healthy diet    Eat a diet that includes plenty of vegetables, fruits, low-fat dairy products, and lean protein.  Do not eat a lot of foods that are high in solid fats, added sugars, or sodium.  Maintain a healthy weight  Body mass index (BMI) is a measurement that can be used to identify possible weight problems. It estimates body fat based on height and weight. Your health care provider can help determine your BMI and help you achieve or maintain a healthy weight.  Get regular exercise  Get regular exercise. This is one of the most important things you can do for your health. Most adults should:  Exercise for at least 150 minutes each week. The exercise should increase your heart rate and make you sweat (moderate-intensity exercise).  Do strengthening exercises at least twice a week. This is in addition to the moderate-intensity exercise.  Spend less time sitting. Even light physical activity can be beneficial.  Watch cholesterol and blood lipids  Have your blood tested for lipids and cholesterol at 40 years of age, then have this test every 5 years.  You may need to have your cholesterol levels checked more often if:  Your lipid or cholesterol levels are high.  You are older than 40 years of age.  You are at high risk for heart disease.  What should I know about cancer screening?  Many types of cancers can be detected early and may often be prevented. Depending on your health history and family history, you may need to have cancer screening at various ages. This may include screening for:  Colorectal cancer.  Prostate cancer.  Skin cancer.  Lung  cancer.  What should I know about heart disease, diabetes, and high blood pressure?  Blood pressure and heart disease  High blood pressure causes heart disease and increases the risk of stroke. This is more likely to develop in people who have high blood pressure readings or are overweight.  Talk with your health care provider about your target blood pressure readings.  Have your blood pressure checked:  Every 3-5 years if you are 9-95 years of age.  Every year if you are 85 years old or older.  If you are between the ages of 29 and 29 and are a current or former smoker, ask your health care provider if you should have a one-time screening for abdominal aortic aneurysm (AAA).  Diabetes  Have regular diabetes screenings. This checks your fasting blood sugar level. Have the screening done:  Once every three years after age 23 if you are at a normal weight and have a low risk for diabetes.  More often and at a younger age if you are overweight or have a high risk for diabetes.  What should I know about preventing infection?  Hepatitis B  If you have a higher risk for hepatitis B, you should be screened for this virus. Talk with your health care provider to find out if you are at risk for hepatitis B infection.  Hepatitis C  Blood testing is recommended for:  Everyone born from [birth year] through 1967.  Anyone  with known risk factors for hepatitis C.  Sexually transmitted infections (STIs)  You should be screened each year for STIs, including gonorrhea and chlamydia, if:  You are sexually active and are younger than 40 years of age.  You are older than 40 years of age and your health care provider tells you that you are at risk for this type of infection.  Your sexual activity has changed since you were last screened, and you are at increased risk for chlamydia or gonorrhea. Ask your health care provider if you are at risk.  Ask your health care provider about whether you are at high risk for HIV. Your health care provider  may recommend a prescription medicine to help prevent HIV infection. If you choose to take medicine to prevent HIV, you should first get tested for HIV. You should then be tested every 3 months for as long as you are taking the medicine.  Follow these instructions at home:  Alcohol use  Do not drink alcohol if your health care provider tells you not to drink.  If you drink alcohol:  Limit how much you have to 0-2 drinks a day.  Know how much alcohol is in your drink. In the [place] one drink equals one 12 oz bottle of beer (355 mL), one 5 oz glass of wine (148 mL), or one 1 oz glass of hard liquor (44 mL).  Lifestyle  Do not use any products that contain nicotine or tobacco. These products include cigarettes, chewing tobacco, and vaping devices, such as e-cigarettes. If you need help quitting, ask your health care provider.  Do not use street drugs.  Do not share needles.  Ask your health care provider for help if you need support or information about quitting drugs.  General instructions  Schedule regular health, dental, and eye exams.  Stay current with your vaccines.  Tell your health care provider if:  You often feel depressed.  You have ever been abused or do not feel safe at home.  Summary  Adopting a healthy lifestyle and getting preventive care are important in promoting health and wellness.  Follow your health care provider's instructions about healthy diet, exercising, and getting tested or screened for diseases.  Follow your health care provider's instructions on monitoring your cholesterol and blood pressure.  This information is not intended to replace advice given to you by your health care provider. Make sure you discuss any questions you have with your health care provider.  Document Revised: 06/04/2020 Document Reviewed: 06/04/2020  [employer]vier Patient Education  2024 [employer].

## 2023-05-14 ENCOUNTER — Other Ambulatory Visit: Payer: Self-pay | Admitting: Internal Medicine
